# Patient Record
Sex: Male | Born: 1950
Health system: Southern US, Community
[De-identification: ages and names within clinical notes are randomized; demographics above are authoritative.]

## PROBLEM LIST (undated history)

## (undated) DIAGNOSIS — C61 Malignant neoplasm of prostate: Secondary | ICD-10-CM

## (undated) HISTORY — DX: Malignant neoplasm of prostate: C61

---

## 2017-07-25 ENCOUNTER — Encounter: Payer: Self-pay | Admitting: Family Medicine

## 2017-07-25 ENCOUNTER — Ambulatory Visit (INDEPENDENT_AMBULATORY_CARE_PROVIDER_SITE_OTHER): Payer: 59 | Admitting: Family Medicine

## 2017-07-25 VITALS — BP 110/78 | HR 62 | Temp 98.4°F | Ht 67.0 in | Wt 176.0 lb

## 2017-07-25 DIAGNOSIS — Z0000 Encounter for general adult medical examination without abnormal findings: Secondary | ICD-10-CM | POA: Diagnosis not present

## 2017-07-25 DIAGNOSIS — E782 Mixed hyperlipidemia: Secondary | ICD-10-CM

## 2017-07-25 DIAGNOSIS — Z125 Encounter for screening for malignant neoplasm of prostate: Secondary | ICD-10-CM | POA: Diagnosis not present

## 2017-07-25 DIAGNOSIS — Z1211 Encounter for screening for malignant neoplasm of colon: Secondary | ICD-10-CM

## 2017-07-25 DIAGNOSIS — Z131 Encounter for screening for diabetes mellitus: Secondary | ICD-10-CM | POA: Diagnosis not present

## 2017-07-25 LAB — CBC WITH DIFFERENTIAL/PLATELET
BASOS ABS: 0 10*3/uL (ref 0.0–0.1)
Basophils Relative: 0.4 % (ref 0.0–3.0)
Eosinophils Absolute: 0 10*3/uL (ref 0.0–0.7)
Eosinophils Relative: 0.8 % (ref 0.0–5.0)
HEMATOCRIT: 41.5 % (ref 39.0–52.0)
Hemoglobin: 14.4 g/dL (ref 13.0–17.0)
LYMPHS PCT: 24.7 % (ref 12.0–46.0)
Lymphs Abs: 1.4 10*3/uL (ref 0.7–4.0)
MCHC: 34.7 g/dL (ref 30.0–36.0)
MCV: 95.3 fl (ref 78.0–100.0)
MONOS PCT: 7.2 % (ref 3.0–12.0)
Monocytes Absolute: 0.4 10*3/uL (ref 0.1–1.0)
NEUTROS ABS: 3.9 10*3/uL (ref 1.4–7.7)
Neutrophils Relative %: 66.9 % (ref 43.0–77.0)
Platelets: 285 10*3/uL (ref 150.0–400.0)
RBC: 4.35 Mil/uL (ref 4.22–5.81)
RDW: 13.3 % (ref 11.5–15.5)
WBC: 5.8 10*3/uL (ref 4.0–10.5)

## 2017-07-25 LAB — LIPID PANEL
CHOL/HDL RATIO: 3
Cholesterol: 242 mg/dL — ABNORMAL HIGH (ref 0–200)
HDL: 83.7 mg/dL (ref >39.00)
LDL CALC: 127 mg/dL — AB (ref 0–99)
NONHDL: 158.7
Triglycerides: 159 mg/dL — ABNORMAL HIGH (ref 0.0–149.0)
VLDL: 31.8 mg/dL (ref 0.0–40.0)

## 2017-07-25 LAB — BASIC METABOLIC PANEL
BUN: 18 mg/dL (ref 6–23)
CHLORIDE: 103 meq/L (ref 96–112)
CO2: 28 meq/L (ref 19–32)
Calcium: 9.8 mg/dL (ref 8.4–10.5)
Creatinine, Ser: 0.84 mg/dL (ref 0.40–1.50)
GFR: 97.5 mL/min (ref >60.00)
Glucose, Bld: 96 mg/dL (ref 70–99)
Potassium: 4.3 mEq/L (ref 3.5–5.1)
Sodium: 141 mEq/L (ref 135–145)

## 2017-07-25 LAB — HEMOGLOBIN A1C: Hgb A1c MFr Bld: 5.6 % (ref 4.6–6.5)

## 2017-07-25 NOTE — Progress Notes (Signed)
Patient presents to clinic today to establish care.  SUBJECTIVE: PMH: Pt is a 67 yo male with pmh sig for HLD.  Pt has not had a pcp in yrs.  He lives in [place], but works in the area.  Patient is not on any medications, but does take supplements including: Centrum, green tea, saw palmetto, fish oil, B12.  Prostate concerns: -recently seen at [facility] for ED/testosterone replacement.   -was informed of elevated PSA (8.0) and advised to follow-up with a pcp.  -Pt denies urinary frequency, hesitancy, incomplete bladder emptying, dysuria, back pain, weight loss.   -Pt does note his stream is not as strong as it used to be.  Pt is concerned he has prostate cancer.  HLD: -Pt states his cholesterol was elevated at community health screen. -Pt is currently taking fish oil   Allergies: NKDA  Past surgical Hx: Pin in the left hand fifth digit  Social history: Pt is divorced.  Pt has a son and a daughter.  He currently has an Loss adjuster, chartered and works as a Actor.  Pt endorses daily alcohol use.  Pt endorses smoking in the past,  He quit 10 years ago.  Was smoking 1 pack/day x 20 years.  Pt denies drug use.  Family medical history: Mom-deceased at age 57, MI Dad-deceased at age 58, skin cancer Sister-Lynn, alive Sister-Pat, alive    Health Maintenance: Dental --Dr. Jetty Duhamel  Immunizations --none recently Colonoscopy --never had   History reviewed. No pertinent past medical history.  History reviewed. No pertinent surgical history.  No current outpatient medications on file prior to visit.   No current facility-administered medications on file prior to visit.     No Known Allergies  History reviewed. No pertinent family history.  Social History   Socioeconomic History  . Marital status: Unknown    Spouse name: Not on file  . Number of children: Not on file  . Years of education: Not on file  . Highest education level: Not on file  Occupational  History  . Not on file  Social Needs  . Financial resource strain: Not on file  . Food insecurity:    Worry: Not on file    Inability: Not on file  . Transportation needs:    Medical: Not on file    Non-medical: Not on file  Tobacco Use  . Smoking status: Never Smoker  . Smokeless tobacco: Never Used  Substance and Sexual Activity  . Alcohol use: Yes  . Drug use: Never  . Sexual activity: Not on file  Lifestyle  . Physical activity:    Days per week: Not on file    Minutes per session: Not on file  . Stress: Not on file  Relationships  . Social connections:    Talks on phone: Not on file    Gets together: Not on file    Attends religious service: Not on file    Active member of club or organization: Not on file    Attends meetings of clubs or organizations: Not on file    Relationship status: Not on file  . Intimate partner violence:    Fear of current or ex partner: Not on file    Emotionally abused: Not on file    Physically abused: Not on file    Forced sexual activity: Not on file  Other Topics Concern  . Not on file  Social History Narrative  . Not on file    ROS  General: Denies fever, chills, night sweats, changes in weight, changes in appetite HEENT: Denies headaches, ear pain, changes in vision, rhinorrhea, sore throat CV: Denies CP, palpitations, SOB, orthopnea Pulm: Denies SOB, cough, wheezing GI: Denies abdominal pain, nausea, vomiting, diarrhea, constipation GU: Denies dysuria, hematuria, frequency, vaginal discharge  +elevated PSA Msk: Denies muscle cramps, joint pains Neuro: Denies weakness, numbness, tingling Skin: Denies rashes, bruising Psych: Denies depression, anxiety, hallucinations  BP 110/78 (BP Location: Left Arm, Patient Position: Sitting, Cuff Size: Normal)   Pulse 62   Temp 98.4 F (36.9 C) (Oral)   Ht 5\' 7"  (1.702 m)   Wt 176 lb (79.8 kg)   SpO2 98%   BMI 27.57 kg/m   Physical Exam Gen. Pleasant, well developed,  well-nourished, in NAD HEENT - [place]/AT, PERRL, no scleral icterus, no nasal drainage, pharynx without erythema or exudate.  TMs normal bilaterally.  No cervical lymphadenopathy. Lungs: no use of accessory muscles, CTAB, no wheezes, rales or rhonchi Cardiovascular: RRR, No r/g/m, no peripheral edema Abdomen: BS present, soft, nontender, nondistended, no hepatosplenomegaly Musculoskeletal: No deformities, moves all four extremities, no cyanosis or clubbing, normal tone Neuro:  A&Ox3, CN II-XII intact, normal gait Skin:  Warm, dry, intact, no lesions  No results found for this or any previous visit (from the past 2160 hour(s)).  Assessment/Plan: Well adult exam  Anticipatory guidance given including wearing seatbelts, smoke detectors in the home, increasing physical activity, increasing p.o. intake of water and vegetables. - Plan: CBC with Differential/Platelet, Basic metabolic panel -Given handout -Patient to return in 1 week for  DRE/prostate exam  Prostate cancer screening  - Plan: PSA, Total and Free -If needed will refer to urology.  Mixed hyperlipidemia  - Plan: Lipid panel  Screening for diabetes mellitus  - Plan: Hemoglobin A1c  Screen for colon cancer  - Plan: Ambulatory referral to Gastroenterology   Follow-up in 1 week  Grier Mitts, MD

## 2017-07-25 NOTE — Patient Instructions (Signed)
Preventive Care 40-64 Years, Male Preventive care refers to lifestyle choices and visits with your health care provider that can promote health and wellness. What does preventive care include?  A yearly physical exam. This is also called an annual well check.  Dental exams once or twice a year.  Routine eye exams. Ask your health care provider how often you should have your eyes checked.  Personal lifestyle choices, including: ? Daily care of your teeth and gums. ? Regular physical activity. ? Eating a healthy diet. ? Avoiding tobacco and drug use. ? Limiting alcohol use. ? Practicing safe sex. ? Taking low-dose aspirin every day starting at age 39. What happens during an annual well check? The services and screenings done by your health care provider during your annual well check will depend on your age, overall health, lifestyle risk factors, and family history of disease. Counseling Your health care provider may ask you questions about your:  Alcohol use.  Tobacco use.  Drug use.  Emotional well-being.  Home and relationship well-being.  Sexual activity.  Eating habits.  Work and work Statistician.  Screening You may have the following tests or measurements:  Height, weight, and BMI.  Blood pressure.  Lipid and cholesterol levels. These may be checked every 5 years, or more frequently if you are over 76 years old.  Skin check.  Lung cancer screening. You may have this screening every year starting at age 19 if you have a 30-pack-year history of smoking and currently smoke or have quit within the past 15 years.  Fecal occult blood test (FOBT) of the stool. You may have this test every year starting at age 26.  Flexible sigmoidoscopy or colonoscopy. You may have a sigmoidoscopy every 5 years or a colonoscopy every 10 years starting at age 17.  Prostate cancer screening. Recommendations will vary depending on your family history and other risks.  Hepatitis C  blood test.  Hepatitis B blood test.  Sexually transmitted disease (STD) testing.  Diabetes screening. This is done by checking your blood sugar (glucose) after you have not eaten for a while (fasting). You may have this done every 1-3 years.  Discuss your test results, treatment options, and if necessary, the need for more tests with your health care provider. Vaccines Your health care provider may recommend certain vaccines, such as:  Influenza vaccine. This is recommended every year.  Tetanus, diphtheria, and acellular pertussis (Tdap, Td) vaccine. You may need a Td booster every 10 years.  Varicella vaccine. You may need this if you have not been vaccinated.  Zoster vaccine. You may need this after age 79.  Measles, mumps, and rubella (MMR) vaccine. You may need at least one dose of MMR if you were born in 1957 or later. You may also need a second dose.  Pneumococcal 13-valent conjugate (PCV13) vaccine. You may need this if you have certain conditions and have not been vaccinated.  Pneumococcal polysaccharide (PPSV23) vaccine. You may need one or two doses if you smoke cigarettes or if you have certain conditions.  Meningococcal vaccine. You may need this if you have certain conditions.  Hepatitis A vaccine. You may need this if you have certain conditions or if you travel or work in places where you may be exposed to hepatitis A.  Hepatitis B vaccine. You may need this if you have certain conditions or if you travel or work in places where you may be exposed to hepatitis B.  Haemophilus influenzae type b (Hib) vaccine.  You may need this if you have certain risk factors.  Talk to your health care provider about which screenings and vaccines you need and how often you need them. This information is not intended to replace advice given to you by your health care provider. Make sure you discuss any questions you have with your health care provider. Document Released: 02/21/2015  Document Revised: 10/15/2015 Document Reviewed: 11/26/2014 Elsevier Interactive Patient Education  2018 [place] Antigen Test Why am I having this test? The prostate-specific antigen (PSA) test is performed to determine how much PSA you have in your blood. PSA is a type of protein that is normally present in the prostate gland. Certain conditions can cause PSA blood levels to increase, such as:  Infection in the prostate (prostatitis).  Enlargement of the prostate (hypertrophy).  Prostate cancer.  Because PSA levels increase greatly from prostate cancer, this test can be used to confirm a diagnosis of prostate cancer. It may also be used to monitor treatment for prostate cancer and to watch for a return of prostate cancer after treatment has finished. This test has a very high false-positive rate. Therefore, routine PSA screening for all men is no longer recommended. A false-positive result is incorrect because it indicates a condition or finding is present when it is not. What kind of sample is taken? A blood sample is required for this test. It is usually collected by inserting a needle into a vein or by sticking a finger with a small needle. How do I prepare for this test? There is no preparation required for this test. However, there are factors that can affect the results of a PSA test. To get the most accurate results:  Avoid having a rectal exam within several hours before having your blood drawn for this test.  Avoid having any procedures performed on the prostate gland within 6 weeks of having this test.  Avoid ejaculating within 24 hours of having this test.  Tell your health care provider if you had a recent urinary tract infection (UTI).  Tell your health care provider if you are taking medicines to assist with hair growth, such as finasteride.  Tell your health care provider if you have been exposed to a medicine called diethylstilbestrol.  Let your  health care provider know if any of these factors apply to you. You may be asked to reschedule the test. What are the reference ranges? Reference ranges are established after testing a large group of people. Reference ranges may vary among different people, labs, and hospitals. It is your responsibility to obtain your test results. Ask the lab or department performing the test when and how you will get your results.  Low: 0-2.5 ng/mL.  Slightly to moderately elevated: 2.6-10.0 ng/mL.  Moderately elevated: 10.0-19.9 ng/mL.  Significantly elevated: 20 ng/mL or greater.  What do the results mean? PSA test results greater than 4 ng/mL are found in the majority of men with prostate cancer. If your test result is above this level, this can indicate an increased risk for prostate cancer. Increased PSA levels can also indicate other health conditions. Talk with your health care provider to discuss your results, treatment options, and if necessary, the need for more tests. Talk with your health care provider if you have any questions about your results. Talk with your health care provider to discuss your results, treatment options, and if necessary, the need for more tests. Talk with your health care provider if you have any questions about  your results. This information is not intended to replace advice given to you by your health care provider. Make sure you discuss any questions you have with your health care provider. Document Released: 02/28/2004 Document Revised: 10/01/2015 Document Reviewed: 06/20/2013 Elsevier Interactive Patient Education  [employer].

## 2017-07-26 LAB — PSA, TOTAL AND FREE
PSA, % FREE: 8 % — AB (ref >25)
PSA, FREE: 0.6 ng/mL
PSA, Total: 7.8 ng/mL — ABNORMAL HIGH (ref <=4.0)

## 2017-07-27 ENCOUNTER — Other Ambulatory Visit: Payer: Self-pay | Admitting: Family Medicine

## 2017-07-27 DIAGNOSIS — R972 Elevated prostate specific antigen [PSA]: Secondary | ICD-10-CM

## 2017-07-29 ENCOUNTER — Other Ambulatory Visit: Payer: Self-pay | Admitting: Family Medicine

## 2017-07-29 DIAGNOSIS — E7800 Pure hypercholesterolemia, unspecified: Secondary | ICD-10-CM

## 2017-08-03 ENCOUNTER — Ambulatory Visit: Payer: 59 | Admitting: Family Medicine

## 2017-08-23 ENCOUNTER — Ambulatory Visit: Payer: Self-pay | Admitting: Adult Health

## 2017-09-01 DIAGNOSIS — R972 Elevated prostate specific antigen [PSA]: Secondary | ICD-10-CM | POA: Diagnosis not present

## 2017-09-01 DIAGNOSIS — R351 Nocturia: Secondary | ICD-10-CM | POA: Diagnosis not present

## 2017-09-01 DIAGNOSIS — R350 Frequency of micturition: Secondary | ICD-10-CM | POA: Diagnosis not present

## 2017-10-13 DIAGNOSIS — C61 Malignant neoplasm of prostate: Secondary | ICD-10-CM | POA: Diagnosis not present

## 2017-10-20 DIAGNOSIS — C61 Malignant neoplasm of prostate: Secondary | ICD-10-CM | POA: Diagnosis not present

## 2017-11-03 ENCOUNTER — Encounter: Payer: Self-pay | Admitting: Family Medicine

## 2017-11-03 ENCOUNTER — Ambulatory Visit (INDEPENDENT_AMBULATORY_CARE_PROVIDER_SITE_OTHER): Payer: 59 | Admitting: Family Medicine

## 2017-11-03 VITALS — BP 108/68 | HR 88 | Temp 98.8°F | Wt 173.0 lb

## 2017-11-03 DIAGNOSIS — Z8781 Personal history of (healed) traumatic fracture: Secondary | ICD-10-CM

## 2017-11-03 DIAGNOSIS — C61 Malignant neoplasm of prostate: Secondary | ICD-10-CM

## 2017-11-03 DIAGNOSIS — M5442 Lumbago with sciatica, left side: Secondary | ICD-10-CM

## 2017-11-03 MED ORDER — CYCLOBENZAPRINE HCL 5 MG PO TABS: 5.0000 mg | ORAL_TABLET | Freq: Three times a day (TID) | ORAL | 1 refills | Status: DC | PRN

## 2017-11-03 NOTE — Progress Notes (Signed)
Subjective:    Patient ID: Charles Houston, male    DOB: 1950/03/01, 67 y.o.   MRN: [id number]  No chief complaint on file.   HPI Patient was seen today for f/u and acute concern.  Pt notes he has seen Urology s/p elevated PSA (7.8) and has been dx'd with prostate cancer.  Pt states he is dealing with the dx.  Pt is scheduled to have an MRI in a few wks, but expresses concerns about possible metal in his body.  Pt had pins in his L 5th digit, but is unsure if they are remaining as he had 2 procedures on that finger.  Pt does not recall the name of the place in [place] where he had this done.  Pt notes back pain and L sided posterior L pain after digging a ditch 2 wks ago.  Pt has taken aleeve in the am and flexeril in the evening.  History reviewed. No pertinent past medical history.  No Known Allergies  ROS General: Denies fever, chills, night sweats, changes in weight, changes in appetite HEENT: Denies headaches, ear pain, changes in vision, rhinorrhea, sore throat CV: Denies CP, palpitations, SOB, orthopnea Pulm: Denies SOB, cough, wheezing GI: Denies abdominal pain, nausea, vomiting, diarrhea, constipation GU: Denies dysuria, hematuria, frequency, vaginal discharge  +prostate cancer Msk: Denies muscle cramps, joint pains  +Low back pain and L leg pain.  H/o L 5th digit fracture. Neuro: Denies weakness, numbness, tingling Skin: Denies rashes, bruising Psych: Denies depression, anxiety, hallucinations     Objective:    Blood pressure 108/68, pulse 88, temperature 98.8 F (37.1 C), temperature source Oral, weight 173 lb (78.5 kg), SpO2 95 %.   Gen. Pleasant, well-nourished, in no distress, normal affect   Lungs: no accessory muscle use, CTAB, no wheezes or rales Cardiovascular: RRR, no m/r/g, no peripheral edema Musculoskeletal: No TTP of spine, paraspinal muscles.  Mild TTP of L sciatic nerve.  Decreased flexion of lumbar spine 2/2 back discomfort.  No deformities, no cyanosis  or clubbing. Neuro:  A&Ox3, CN II-XII intact, normal gait   Wt Readings from Last 3 Encounters:  11/03/17 173 lb (78.5 kg)  07/25/17 176 lb (79.8 kg)    Lab Results  Component Value Date   WBC 5.8 07/25/2017   HGB 14.4 07/25/2017   HCT 41.5 07/25/2017   PLT 285.0 07/25/2017   GLUCOSE 96 07/25/2017   CHOL 242 (H) 07/25/2017   TRIG 159.0 (H) 07/25/2017   HDL 83.70 07/25/2017   LDLCALC 127 (H) 07/25/2017   NA 141 07/25/2017   K 4.3 07/25/2017   CL 103 07/25/2017   CREATININE 0.84 07/25/2017   BUN 18 07/25/2017   CO2 28 07/25/2017   HGBA1C 5.6 07/25/2017    Assessment/Plan:  Acute left-sided low back pain with left-sided sciatica  -discussed stretching exercises -ok to continue Aleeve prn, heat, massage - Plan: cyclobenzaprine (FLEXERIL) 5 MG tablet  Prostate cancer ([place]) -continue f/u with Urology  History of fracture of finger  -pt to return to clinic as xray was unavailable Thursday afternoon - Plan: DG Hand 2 View Left  F/u prn  Grier Mitts, MD

## 2017-11-03 NOTE — Patient Instructions (Signed)
Sciatica Sciatica is pain, numbness, weakness, or tingling along the path of the sciatic nerve. The sciatic nerve starts in the lower back and runs down the back of each leg. The nerve controls the muscles in the lower leg and in the back of the knee. It also provides feeling (sensation) to the back of the thigh, the lower leg, and the sole of the foot. Sciatica is a symptom of another medical condition that pinches or puts pressure on the sciatic nerve. Generally, sciatica only affects one side of the body. Sciatica usually goes away on its own or with treatment. In some cases, sciatica may keep coming back (recur). What are the causes? This condition is caused by pressure on the sciatic nerve, or pinching of the sciatic nerve. This may be the result of:  A disk in between the bones of the spine (vertebrae) bulging out too far (herniated disk).  Age-related changes in the spinal disks (degenerative disk disease).  A pain disorder that affects a muscle in the buttock (piriformis syndrome).  Extra bone growth (bone spur) near the sciatic nerve.  An injury or break (fracture) of the pelvis.  Pregnancy.  Tumor (rare). What increases the risk? The following factors may make you more likely to develop this condition:  Playing sports that place pressure or stress on the spine, such as football or weight lifting.  Having poor strength and flexibility.  A history of back injury.  A history of back surgery.  Sitting for long periods of time.  Doing activities that involve repetitive bending or lifting.  Obesity. What are the signs or symptoms? Symptoms can vary from mild to very severe, and they may include:  Any of these problems in the lower back, leg, hip, or buttock:  Mild tingling or dull aches.  Burning sensations.  Sharp pains.  Numbness in the back of the calf or the sole of the foot.  Leg weakness.  Severe back pain that makes movement difficult. These symptoms may  get worse when you cough, sneeze, or laugh, or when you sit or stand for long periods of time. Being overweight may also make symptoms worse. In some cases, symptoms may recur over time. How is this diagnosed? This condition may be diagnosed based on:  Your symptoms.  A physical exam. Your health care provider may ask you to do certain movements to check whether those movements trigger your symptoms.  You may have tests, including:  Blood tests.  X-rays.  MRI.  CT scan. How is this treated? In many cases, this condition improves on its own, without any treatment. However, treatment may include:  Reducing or modifying physical activity during periods of pain.  Exercising and stretching to strengthen your abdomen and improve the flexibility of your spine.  Icing and applying heat to the affected area.  Medicines that help:  To relieve pain and swelling.  To relax your muscles.  Injections of medicines that help to relieve pain, irritation, and inflammation around the sciatic nerve (steroids).  Surgery. Follow these instructions at home: Medicines   Take over-the-counter and prescription medicines only as told by your health care provider.  Do not drive or operate heavy machinery while taking prescription pain medicine. Managing pain   If directed, apply ice to the affected area.  Put ice in a plastic bag.  Place a towel between your skin and the bag.  Leave the ice on for 20 minutes, 2-3 times a day.  After icing, apply heat to the   heat to the affected area before you exercise or as often as told by your health care provider. Use the heat source that your health care provider recommends, such as a moist heat pack or a heating pad. ? Place a towel between your skin and the heat source. ? Leave the heat on for 20-30 minutes. ? Remove the heat if your skin turns bright red. This is especially important if you are unable to feel pain, heat, or cold. You may have a  greater risk of getting burned. Activity  Return to your normal activities as told by your health care provider. Ask your health care provider what activities are safe for you. ? Avoid activities that make your symptoms worse.  Take brief periods of rest throughout the day. Resting in a lying or standing position is usually better than sitting to rest. ? When you rest for longer periods, mix in some mild activity or stretching between periods of rest. This will help to prevent stiffness and pain. ? Avoid sitting for long periods of time without moving. Get up and move around at least one time each hour.  Exercise and stretch regularly, as told by your health care provider.  Do not lift anything that is heavier than 10 lb (4.5 kg) while you have symptoms of sciatica. When you do not have symptoms, you should still avoid heavy lifting, especially repetitive heavy lifting.  When you lift objects, always use proper lifting technique, which includes: ? Bending your knees. ? Keeping the load close to your body. ? Avoiding twisting. General instructions  Use good posture. ? Avoid leaning forward while sitting. ? Avoid hunching over while standing.  Maintain a healthy weight. Excess weight puts extra stress on your back and makes it difficult to maintain good posture.  Wear supportive, comfortable shoes. Avoid wearing high heels.  Avoid sleeping on a mattress that is too soft or too hard. A mattress that is firm enough to support your back when you sleep may help to reduce your pain.  Keep all follow-up visits as told by your health care provider. This is important. Contact a health care provider if:  You have pain that wakes you up when you are sleeping.  You have pain that gets worse when you lie down.  Your pain is worse than you have experienced in the past.  Your pain lasts longer than 4 weeks.  You experience unexplained weight loss. Get help right away if:  You lose control  of your bowel or bladder (incontinence).  You have: ? Weakness in your lower back, pelvis, buttocks, or legs that gets worse. ? Redness or swelling of your back. ? A burning sensation when you urinate. This information is not intended to replace advice given to you by your health care provider. Make sure you discuss any questions you have with your health care provider. Document Released: 01/19/2001 Document Revised: 07/01/2015 Document Reviewed: 10/04/2014 Elsevier Interactive Patient Education  2018 [employer].  Sciatica Rehab Ask your health care provider which exercises are safe for you. Do exercises exactly as told by your health care provider and adjust them as directed. It is normal to feel mild stretching, pulling, tightness, or discomfort as you do these exercises, but you should stop right away if you feel sudden pain or your pain gets worse.Do not begin these exercises until told by your health care provider. Stretching and range of motion exercises These exercises warm up your muscles and joints and improve the movement  and flexibility of your hips and your back. These exercises also help to relieve pain, numbness, and tingling. Exercise A: Sciatic nerve glide 1. Sit in a chair with your head facing down toward your chest. Place your hands behind your back. Let your shoulders slump forward. 2. Slowly straighten one of your knees while you tilt your head back as if you are looking toward the ceiling. Only straighten your leg as far as you can without making your symptoms worse. 3. Hold for __________ seconds. 4. Slowly return to the starting position. 5. Repeat with your other leg. Repeat __________ times. Complete this exercise __________ times a day. Exercise B: Knee to chest with hip adduction and internal rotation  1. Lie on your back on a firm surface with both legs straight. 2. Bend one of your knees and move it up toward your chest until you feel a gentle stretch in your  lower back and buttock. Then, move your knee toward the shoulder that is on the opposite side from your leg. ? Hold your leg in this position by holding onto the front of your knee. 3. Hold for __________ seconds. 4. Slowly return to the starting position. 5. Repeat with your other leg. Repeat __________ times. Complete this exercise __________ times a day. Exercise C: Prone extension on elbows  1. Lie on your abdomen on a firm surface. A bed may be too soft for this exercise. 2. Prop yourself up on your elbows. 3. Use your arms to help lift your chest up until you feel a gentle stretch in your abdomen and your lower back. ? This will place some of your body weight on your elbows. If this is uncomfortable, try stacking pillows under your chest. ? Your hips should stay down, against the surface that you are lying on. Keep your hip and back muscles relaxed. 4. Hold for __________ seconds. 5. Slowly relax your upper body and return to the starting position. Repeat __________ times. Complete this exercise __________ times a day. Strengthening exercises These exercises build strength and endurance in your back. Endurance is the ability to use your muscles for a long time, even after they get tired. Exercise D: Pelvic tilt 1. Lie on your back on a firm surface. Bend your knees and keep your feet flat. 2. Tense your abdominal muscles. Tip your pelvis up toward the ceiling and flatten your lower back into the floor. ? To help with this exercise, you may place a small towel under your lower back and try to push your back into the towel. 3. Hold for __________ seconds. 4. Let your muscles relax completely before you repeat this exercise. Repeat __________ times. Complete this exercise __________ times a day. Exercise E: Alternating arm and leg raises  1. Get on your hands and knees on a firm surface. If you are on a hard floor, you may want to use padding to cushion your knees, such as an exercise  mat. 2. Line up your arms and legs. Your hands should be below your shoulders, and your knees should be below your hips. 3. Lift your left leg behind you. At the same time, raise your right arm and straighten it in front of you. ? Do not lift your leg higher than your hip. ? Do not lift your arm higher than your shoulder. ? Keep your abdominal and back muscles tight. ? Keep your hips facing the ground. ? Do not arch your back. ? Keep your balance carefully, and do not  hold your breath. 4. Hold for __________ seconds. 5. Slowly return to the starting position and repeat with your right leg and your left arm. Repeat __________ times. Complete this exercise __________ times a day. Posture and body mechanics  Body mechanics refers to the movements and positions of your body while you do your daily activities. Posture is part of body mechanics. Good posture and healthy body mechanics can help to relieve stress in your body's tissues and joints. Good posture means that your spine is in its natural S-curve position (your spine is neutral), your shoulders are pulled back slightly, and your head is not tipped forward. The following are general guidelines for applying improved posture and body mechanics to your everyday activities. Standing   When standing, keep your spine neutral and your feet about hip-width apart. Keep a slight bend in your knees. Your ears, shoulders, and hips should line up.  When you do a task in which you stand in one place for a long time, place one foot up on a stable object that is 2-4 inches (5-10 cm) high, such as a footstool. This helps keep your spine neutral. Sitting   When sitting, keep your spine neutral and keep your feet flat on the floor. Use a footrest, if necessary, and keep your thighs parallel to the floor. Avoid rounding your shoulders, and avoid tilting your head forward.  When working at a desk or a computer, keep your desk at a height where your hands are  slightly lower than your elbows. Slide your chair under your desk so you are close enough to maintain good posture.  When working at a computer, place your monitor at a height where you are looking straight ahead and you do not have to tilt your head forward or downward to look at the screen. Resting   When lying down and resting, avoid positions that are most painful for you.  If you have pain with activities such as sitting, bending, stooping, or squatting (flexion-based activities), lie in a position in which your body does not bend very much. For example, avoid curling up on your side with your arms and knees near your chest (fetal position).  If you have pain with activities such as standing for a long time or reaching with your arms (extension-based activities), lie with your spine in a neutral position and bend your knees slightly. Try the following positions: ? Lying on your side with a pillow between your knees. ? Lying on your back with a pillow under your knees. Lifting   When lifting objects, keep your feet at least shoulder-width apart and tighten your abdominal muscles.  Bend your knees and hips and keep your spine neutral. It is important to lift using the strength of your legs, not your back. Do not lock your knees straight out.  Always ask for help to lift heavy or awkward objects. This information is not intended to replace advice given to you by your health care provider. Make sure you discuss any questions you have with your health care provider. Document Released: 01/25/2005 Document Revised: 10/02/2015 Document Reviewed: 10/11/2014 Elsevier Interactive Patient Education  2018 [employer].  Back Exercises If you have pain in your back, do these exercises 2-3 times each day or as told by your doctor. When the pain goes away, do the exercises once each day, but repeat the steps more times for each exercise (do more repetitions). If you do not have pain in your back, do  these  exercises once each day or as told by your doctor. Exercises Single Knee to Chest  Do these steps 3-5 times in a row for each leg: 1. Lie on your back on a firm bed or the floor with your legs stretched out. 2. Bring one knee to your chest. 3. Hold your knee to your chest by grabbing your knee or thigh. 4. Pull on your knee until you feel a gentle stretch in your lower back. 5. Keep doing the stretch for 10-30 seconds. 6. Slowly let go of your leg and straighten it.  Pelvic Tilt  Do these steps 5-10 times in a row: 1. Lie on your back on a firm bed or the floor with your legs stretched out. 2. Bend your knees so they point up to the ceiling. Your feet should be flat on the floor. 3. Tighten your lower belly (abdomen) muscles to press your lower back against the floor. This will make your tailbone point up to the ceiling instead of pointing down to your feet or the floor. 4. Stay in this position for 5-10 seconds while you gently tighten your muscles and breathe evenly.  Cat-Cow  Do these steps until your lower back bends more easily: 1. Get on your hands and knees on a firm surface. Keep your hands under your shoulders, and keep your knees under your hips. You may put padding under your knees. 2. Let your head hang down, and make your tailbone point down to the floor so your lower back is round like the back of a cat. 3. Stay in this position for 5 seconds. 4. Slowly lift your head and make your tailbone point up to the ceiling so your back hangs low (sags) like the back of a cow. 5. Stay in this position for 5 seconds.  Press-Ups  Do these steps 5-10 times in a row: 1. Lie on your belly (face-down) on the floor. 2. Place your hands near your head, about shoulder-width apart. 3. While you keep your back relaxed and keep your hips on the floor, slowly straighten your arms to raise the top half of your body and lift your shoulders. Do not use your back muscles. To make yourself  more comfortable, you may change where you place your hands. 4. Stay in this position for 5 seconds. 5. Slowly return to lying flat on the floor.  Bridges  Do these steps 10 times in a row: 1. Lie on your back on a firm surface. 2. Bend your knees so they point up to the ceiling. Your feet should be flat on the floor. 3. Tighten your butt muscles and lift your butt off of the floor until your waist is almost as high as your knees. If you do not feel the muscles working in your butt and the back of your thighs, slide your feet 1-2 inches farther away from your butt. 4. Stay in this position for 3-5 seconds. 5. Slowly lower your butt to the floor, and let your butt muscles relax.  If this exercise is too easy, try doing it with your arms crossed over your chest. Belly Crunches  Do these steps 5-10 times in a row: 1. Lie on your back on a firm bed or the floor with your legs stretched out. 2. Bend your knees so they point up to the ceiling. Your feet should be flat on the floor. 3. Cross your arms over your chest. 4. Tip your chin a little bit toward your chest but  do not bend your neck. 5. Tighten your belly muscles and slowly raise your chest just enough to lift your shoulder blades a tiny bit off of the floor. 6. Slowly lower your chest and your head to the floor.  Back Lifts Do these steps 5-10 times in a row: 1. Lie on your belly (face-down) with your arms at your sides, and rest your forehead on the floor. 2. Tighten the muscles in your legs and your butt. 3. Slowly lift your chest off of the floor while you keep your hips on the floor. Keep the back of your head in line with the curve in your back. Look at the floor while you do this. 4. Stay in this position for 3-5 seconds. 5. Slowly lower your chest and your face to the floor.  Contact a doctor if:  Your back pain gets a lot worse when you do an exercise.  Your back pain does not lessen 2 hours after you exercise. If you  have any of these problems, stop doing the exercises. Do not do them again unless your doctor says it is okay. Get help right away if:  You have sudden, very bad back pain. If this happens, stop doing the exercises. Do not do them again unless your doctor says it is okay. This information is not intended to replace advice given to you by your health care provider. Make sure you discuss any questions you have with your health care provider. Document Released: 02/27/2010 Document Revised: 07/03/2015 Document Reviewed: 03/21/2014 Elsevier Interactive Patient Education  [employer].

## 2017-11-06 ENCOUNTER — Encounter: Payer: Self-pay | Admitting: Family Medicine

## 2017-11-09 DIAGNOSIS — M9903 Segmental and somatic dysfunction of lumbar region: Secondary | ICD-10-CM | POA: Diagnosis not present

## 2017-11-09 DIAGNOSIS — M5137 Other intervertebral disc degeneration, lumbosacral region: Secondary | ICD-10-CM | POA: Diagnosis not present

## 2017-11-09 DIAGNOSIS — M9914 Subluxation complex (vertebral) of sacral region: Secondary | ICD-10-CM | POA: Diagnosis not present

## 2017-11-09 DIAGNOSIS — M9905 Segmental and somatic dysfunction of pelvic region: Secondary | ICD-10-CM | POA: Diagnosis not present

## 2017-11-16 DIAGNOSIS — M9914 Subluxation complex (vertebral) of sacral region: Secondary | ICD-10-CM | POA: Diagnosis not present

## 2017-11-16 DIAGNOSIS — M9905 Segmental and somatic dysfunction of pelvic region: Secondary | ICD-10-CM | POA: Diagnosis not present

## 2017-11-16 DIAGNOSIS — M5137 Other intervertebral disc degeneration, lumbosacral region: Secondary | ICD-10-CM | POA: Diagnosis not present

## 2017-11-16 DIAGNOSIS — M9903 Segmental and somatic dysfunction of lumbar region: Secondary | ICD-10-CM | POA: Diagnosis not present

## 2017-11-23 DIAGNOSIS — M9905 Segmental and somatic dysfunction of pelvic region: Secondary | ICD-10-CM | POA: Diagnosis not present

## 2017-11-23 DIAGNOSIS — M5137 Other intervertebral disc degeneration, lumbosacral region: Secondary | ICD-10-CM | POA: Diagnosis not present

## 2017-11-23 DIAGNOSIS — M9914 Subluxation complex (vertebral) of sacral region: Secondary | ICD-10-CM | POA: Diagnosis not present

## 2017-11-23 DIAGNOSIS — M9903 Segmental and somatic dysfunction of lumbar region: Secondary | ICD-10-CM | POA: Diagnosis not present

## 2017-12-29 ENCOUNTER — Ambulatory Visit (INDEPENDENT_AMBULATORY_CARE_PROVIDER_SITE_OTHER): Payer: Medicare Other

## 2017-12-29 ENCOUNTER — Other Ambulatory Visit (INDEPENDENT_AMBULATORY_CARE_PROVIDER_SITE_OTHER): Payer: Medicare Other

## 2017-12-29 DIAGNOSIS — E7800 Pure hypercholesterolemia, unspecified: Secondary | ICD-10-CM | POA: Diagnosis not present

## 2017-12-29 DIAGNOSIS — Z8781 Personal history of (healed) traumatic fracture: Secondary | ICD-10-CM | POA: Diagnosis not present

## 2017-12-29 DIAGNOSIS — M19042 Primary osteoarthritis, left hand: Secondary | ICD-10-CM | POA: Diagnosis not present

## 2017-12-29 LAB — LIPID PANEL
CHOL/HDL RATIO: 2
Cholesterol: 165 mg/dL (ref 0–200)
HDL: 67.2 mg/dL (ref >39.00)
LDL CALC: 84 mg/dL (ref 0–99)
NonHDL: 97.33
TRIGLYCERIDES: 69 mg/dL (ref 0.0–149.0)
VLDL: 13.8 mg/dL (ref 0.0–40.0)

## 2018-01-11 ENCOUNTER — Ambulatory Visit (INDEPENDENT_AMBULATORY_CARE_PROVIDER_SITE_OTHER): Payer: Medicare Other | Admitting: Family Medicine

## 2018-01-11 ENCOUNTER — Encounter: Payer: Self-pay | Admitting: Family Medicine

## 2018-01-11 VITALS — BP 110/78 | HR 104 | Temp 98.0°F | Wt 173.0 lb

## 2018-01-11 DIAGNOSIS — M19032 Primary osteoarthritis, left wrist: Secondary | ICD-10-CM

## 2018-01-11 DIAGNOSIS — M25511 Pain in right shoulder: Secondary | ICD-10-CM

## 2018-01-11 DIAGNOSIS — M25512 Pain in left shoulder: Secondary | ICD-10-CM

## 2018-01-11 DIAGNOSIS — M25531 Pain in right wrist: Secondary | ICD-10-CM | POA: Diagnosis not present

## 2018-01-11 MED ORDER — PREDNISONE 10 MG PO TABS: ORAL_TABLET | ORAL | 0 refills | Status: DC

## 2018-01-11 NOTE — Patient Instructions (Addendum)
Prednisone tablets What is this medicine? PREDNISONE (PRED ni sone) is a corticosteroid. It is commonly used to treat inflammation of the skin, joints, lungs, and other organs. Common conditions treated include asthma, allergies, and arthritis. It is also used for other conditions, such as blood disorders and diseases of the adrenal glands. This medicine may be used for other purposes; ask your health care provider or pharmacist if you have questions. COMMON BRAND NAME(S): Deltasone, Predone, Sterapred, Sterapred DS What should I tell my health care provider before I take this medicine? They need to know if you have any of these conditions: -Cushing's syndrome -diabetes -glaucoma -heart disease -high blood pressure -infection (especially a virus infection such as chickenpox, cold sores, or herpes) -kidney disease -liver disease -mental illness -myasthenia gravis -osteoporosis -seizures -stomach or intestine problems -thyroid disease -an unusual or allergic reaction to lactose, prednisone, other medicines, foods, dyes, or preservatives -pregnant or trying to get pregnant -breast-feeding How should I use this medicine? Take this medicine by mouth with a glass of water. Follow the directions on the prescription label. Take this medicine with food. If you are taking this medicine once a day, take it in the morning. Do not take more medicine than you are told to take. Do not suddenly stop taking your medicine because you may develop a severe reaction. Your doctor will tell you how much medicine to take. If your doctor wants you to stop the medicine, the dose may be slowly lowered over time to avoid any side effects. Talk to your pediatrician regarding the use of this medicine in children. Special care may be needed. Overdosage: If you think you have taken too much of this medicine contact a poison control center or emergency room at once. NOTE: This medicine is only for you. Do not share this  medicine with others. What if I miss a dose? If you miss a dose, take it as soon as you can. If it is almost time for your next dose, talk to your doctor or health care professional. You may need to miss a dose or take an extra dose. Do not take double or extra doses without advice. What may interact with this medicine? Do not take this medicine with any of the following medications: -metyrapone -mifepristone This medicine may also interact with the following medications: -aminoglutethimide -amphotericin B -aspirin and aspirin-like medicines -barbiturates -certain medicines for diabetes, like glipizide or glyburide -cholestyramine -cholinesterase inhibitors -cyclosporine -digoxin -diuretics -ephedrine -male hormones, like estrogens and birth control pills -isoniazid -ketoconazole -NSAIDS, medicines for pain and inflammation, like ibuprofen or naproxen -phenytoin -rifampin -toxoids -vaccines -warfarin This list may not describe all possible interactions. Give your health care provider a list of all the medicines, herbs, non-prescription drugs, or dietary supplements you use. Also tell them if you smoke, drink alcohol, or use illegal drugs. Some items may interact with your medicine. What should I watch for while using this medicine? Visit your doctor or health care professional for regular checks on your progress. If you are taking this medicine over a prolonged period, carry an identification card with your name and address, the type and dose of your medicine, and your doctor's name and address. This medicine may increase your risk of getting an infection. Tell your doctor or health care professional if you are around anyone with measles or chickenpox, or if you develop sores or blisters that do not heal properly. If you are going to have surgery, tell your doctor or health care professional that  you have taken this medicine within the last twelve months. Ask your doctor or health  care professional about your diet. You may need to lower the amount of salt you eat. This medicine may affect blood sugar levels. If you have diabetes, check with your doctor or health care professional before you change your diet or the dose of your diabetic medicine. What side effects may I notice from receiving this medicine? Side effects that you should report to your doctor or health care professional as soon as possible: -allergic reactions like skin rash, itching or hives, swelling of the face, lips, or tongue -changes in emotions or moods -changes in vision -depressed mood -eye pain -fever or chills, cough, sore throat, pain or difficulty passing urine -increased thirst -swelling of ankles, feet Side effects that usually do not require medical attention (report to your doctor or health care professional if they continue or are bothersome): -confusion, excitement, restlessness -headache -nausea, vomiting -skin problems, acne, thin and shiny skin -trouble sleeping -weight gain This list may not describe all possible side effects. Call your doctor for medical advice about side effects. You may report side effects to FDA at 1-800-FDA-1088. Where should I keep my medicine? Keep out of the reach of children. Store at room temperature between 15 and 30 degrees C (59 and 86 degrees F). Protect from light. Keep container tightly closed. Throw away any unused medicine after the expiration date. NOTE: This sheet is a summary. It may not cover all possible information. If you have questions about this medicine, talk to your doctor, pharmacist, or health care provider.  2018 Elsevier/Gold Standard (2010-09-10 10:57:14)  Osteoarthritis Osteoarthritis is a type of arthritis that affects tissue that covers the ends of bones in joints (cartilage). Cartilage acts as a cushion between the bones and helps them move smoothly. Osteoarthritis results when cartilage in the joints gets worn down.  Osteoarthritis is sometimes called "wear and tear" arthritis. Osteoarthritis is the most common form of arthritis. It often occurs in older people. It is a condition that gets worse over time (a progressive condition). Joints that are most often affected by this condition are in:  Fingers.  Toes.  Hips.  Knees.  Spine, including neck and lower back.  What are the causes? This condition is caused by age-related wearing down of cartilage that covers the ends of bones. What increases the risk? The following factors may make you more likely to develop this condition:  Older age.  Being overweight or obese.  Overuse of joints, such as in athletes.  Past injury of a joint.  Past surgery on a joint.  Family history of osteoarthritis.  What are the signs or symptoms? The main symptoms of this condition are pain, swelling, and stiffness in the joint. The joint may lose its shape over time. Small pieces of bone or cartilage may break off and float inside of the joint, which may cause more pain and damage to the joint. Small deposits of bone (osteophytes) may grow on the edges of the joint. Other symptoms may include:  A grating or scraping feeling inside the joint when you move it.  Popping or creaking sounds when you move.  Symptoms may affect one or more joints. Osteoarthritis in a major joint, such as your knee or hip, can make it painful to walk or exercise. If you have osteoarthritis in your hands, you might not be able to grip items, twist your hand, or control small movements of your hands and fingers (  fine motor skills). How is this diagnosed? This condition may be diagnosed based on:  Your medical history.  A physical exam.  Your symptoms.  X-rays of the affected joint(s).  Blood tests to rule out other types of arthritis.  How is this treated? There is no cure for this condition, but treatment can help to control pain and improve joint function. Treatment plans may  include:  A prescribed exercise program that allows for rest and joint relief. You may work with a physical therapist.  A weight control plan.  Pain relief techniques, such as: ? Applying heat and cold to the joint. ? Electric pulses delivered to nerve endings under the skin (transcutaneous electrical nerve stimulation, or TENS). ? Massage. ? Certain nutritional supplements.  NSAIDs or prescription medicines to help relieve pain.  Medicine to help relieve pain and inflammation (corticosteroids). This can be given by mouth (orally) or as an injection.  Assistive devices, such as a brace, wrap, splint, specialized glove, or cane.  Surgery, such as: ? An osteotomy. This is done to reposition the bones and relieve pain or to remove loose pieces of bone and cartilage. ? Joint replacement surgery. You may need this surgery if you have very bad (advanced) osteoarthritis.  Follow these instructions at home: Activity  Rest your affected joints as directed by your health care provider.  Do not drive or use heavy machinery while taking prescription pain medicine.  Exercise as directed. Your health care provider or physical therapist may recommend specific types of exercise, such as: ? Strengthening exercises. These are done to strengthen the muscles that support joints that are affected by arthritis. They can be performed with weights or with exercise bands to add resistance. ? Aerobic activities. These are exercises, such as brisk walking or water aerobics, that get your heart pumping. ? Range-of-motion activities. These keep your joints easy to move. ? Balance and agility exercises. Managing pain, stiffness, and swelling  If directed, apply heat to the affected area as often as told by your health care provider. Use the heat source that your health care provider recommends, such as a moist heat pack or a heating pad. ? If you have a removable assistive device, remove it as told by your  health care provider. ? Place a towel between your skin and the heat source. If your health care provider tells you to keep the assistive device on while you apply heat, place a towel between the assistive device and the heat source. ? Leave the heat on for 20-30 minutes. ? Remove the heat if your skin turns bright red. This is especially important if you are unable to feel pain, heat, or cold. You may have a greater risk of getting burned.  If directed, put ice on the affected joint: ? If you have a removable assistive device, remove it as told by your health care provider. ? Put ice in a plastic bag. ? Place a towel between your skin and the bag. If your health care provider tells you to keep the assistive device on during icing, place a towel between the assistive device and the bag. ? Leave the ice on for 20 minutes, 2-3 times a day. General instructions  Take over-the-counter and prescription medicines only as told by your health care provider.  Maintain a healthy weight. Follow instructions from your health care provider for weight control. These may include dietary restrictions.  Do not use any products that contain nicotine or tobacco, such as cigarettes and  e-cigarettes. These can delay bone healing. If you need help quitting, ask your health care provider.  Use assistive devices as directed by your health care provider.  Keep all follow-up visits as told by your health care provider. This is important. Where to find more information:  [employer] of Arthritis and Musculoskeletal and Skin Diseases: www.niams.SouthExposed.es  [employer] on Aging: http://kim-miller.com/  American College of Rheumatology: www.rheumatology.org Contact a health care provider if:  Your skin turns red.  You develop a rash.  You have pain that gets worse.  You have a fever along with joint or muscle aches. Get help right away if:  You lose a lot of weight.  You suddenly lose your  appetite.  You have night sweats. Summary  Osteoarthritis is a type of arthritis that affects tissue covering the ends of bones in joints (cartilage).  This condition is caused by age-related wearing down of cartilage that covers the ends of bones.  The main symptom of this condition is pain, swelling, and stiffness in the joint.  There is no cure for this condition, but treatment can help to control pain and improve joint function. This information is not intended to replace advice given to you by your health care provider. Make sure you discuss any questions you have with your health care provider. Document Released: 01/25/2005 Document Revised: 09/29/2015 Document Reviewed: 09/29/2015 Elsevier Interactive Patient Education  2018 [employer].  Wrist Pain, Adult There are many things that can cause wrist pain. Some common causes include:  An injury to the wrist area, such as a sprain, strain, or fracture.  Overuse of the joint.  A condition that causes increased pressure on a nerve in the wrist (carpal tunnel syndrome).  Wear and tear of the joints that occurs with aging (osteoarthritis).  A variety of other types of arthritis.  Sometimes, the cause of wrist pain is not known. Often, the pain goes away when you follow instructions from your health care provider for relieving pain at home, such as resting or icing the wrist. If your wrist pain continues, it is important to tell your health care provider. Follow these instructions at home:  Rest the wrist area for at least 48 hours or as long as told by your health care provider.  If a splint or elastic bandage has been applied, use it as told by your health care provider. ? Remove the splint or bandage only as told by your health care provider. ? Loosen the splint or bandage if your fingers tingle, become numb, or turn cold or blue.  If directed, apply ice to the injured area. ? If you have a removable splint or elastic  bandage, remove it as told by your health care provider. ? Put ice in a plastic bag. ? Place a towel between your skin and the bag or between your splint or bandage and the bag. ? Leave the ice on for 20 minutes, 2-3 times a day.  Keep your arm raised (elevated) above the level of your heart while you are sitting or lying down.  Take over-the-counter and prescription medicines only as told by your health care provider.  Keep all follow-up visits as told by your health care provider. This is important. Contact a health care provider if:  You have a sudden sharp pain in the wrist, hand, or arm that is different or new.  The swelling or bruising on your wrist or hand gets worse.  Your skin becomes red, gets a rash,  or has open sores.  Your pain does not get better or it gets worse. Get help right away if:  You lose feeling in your fingers or hand.  Your fingers turn white, very red, or cold and blue.  You cannot move your fingers.  You have a fever or chills. This information is not intended to replace advice given to you by your health care provider. Make sure you discuss any questions you have with your health care provider. Document Released: 11/04/2004 Document Revised: 08/21/2015 Document Reviewed: 08/14/2015 Elsevier Interactive Patient Education  [employer].

## 2018-01-11 NOTE — Progress Notes (Signed)
Subjective:    Patient ID: Charles Houston, male    DOB: 11/19/1950, 67 y.o.   MRN: [id number]  No chief complaint on file.   HPI Patient was seen today for ongoing concern.  B/l wrist pain: -pt notes pain x a few wks -pt's chiropractor suggested wearing wrist splints -wearing wrist splints day and night -Patient unable to open jars or bottles. -Has not been able to cook or work out 2/2 pain.  -Endorses muscle atrophy as unable to lift weights/do push ups -Taking arthritis strength Tylenol 1000 mg twice daily -X-ray left hand with OA at first carpometacarpal joint  12/29/17  L Sciatic pain: -endorses improvement in pain since last OFV -May use Flexeril as needed at night   History reviewed. No pertinent past medical history.  No Known Allergies  ROS General: Denies fever, chills, night sweats, changes in weight, changes in appetite HEENT: Denies headaches, ear pain, changes in vision, rhinorrhea, sore throat CV: Denies CP, palpitations, SOB, orthopnea Pulm: Denies SOB, cough, wheezing GI: Denies abdominal pain, nausea, vomiting, diarrhea, constipation GU: Denies dysuria, hematuria, frequency, vaginal discharge Msk: Denies muscle cramps, joint pains  +b/l wrist pain and shoulder pain Neuro: Denies weakness, numbness, tingling Skin: Denies rashes, bruising Psych: Denies depression, anxiety, hallucinations     Objective:    Blood pressure 110/78, pulse (!) 104, temperature 98 F (36.7 C), temperature source Oral, weight 173 lb (78.5 kg), SpO2 96 %.  Gen. Pleasant, well-nourished, in no distress, normal affect   HEENT: [place]/AT, face symmetric, no scleral icterus, PERRLA, nares patent without drainage Lungs: no accessory muscle use, CTAB, no wheezes or rales Cardiovascular: RRR, no m/r/g, no peripheral edema Musculoskeletal: No edema, erythema, or tenderness of bilateral wrist.  Negative Tinel and Phalen's.  No numbness or tingling with tapping of epicondyles bilaterally.   Pain with flexion, extension, lateral movement of wrist bilaterally.  No TTP of b/l shoulders and clavicles.  Limited active and passive ROM of right shoulder.  +crossbody of R shoulder and empty can, unable to put hands behind back 2/2 pain. R shoulder pain >L shoulder pain.  No deformities, no cyanosis or clubbing, normal tone Neuro:  A&Ox3, CN II-XII intact, normal gait Skin:  Warm, no lesions/ rash  Wt Readings from Last 3 Encounters:  01/11/18 173 lb (78.5 kg)  11/03/17 173 lb (78.5 kg)  07/25/17 176 lb (79.8 kg)    Lab Results  Component Value Date   WBC 5.8 07/25/2017   HGB 14.4 07/25/2017   HCT 41.5 07/25/2017   PLT 285.0 07/25/2017   GLUCOSE 96 07/25/2017   CHOL 165 12/29/2017   TRIG 69.0 12/29/2017   HDL 67.20 12/29/2017   LDLCALC 84 12/29/2017   NA 141 07/25/2017   K 4.3 07/25/2017   CL 103 07/25/2017   CREATININE 0.84 07/25/2017   BUN 18 07/25/2017   CO2 28 07/25/2017   HGBA1C 5.6 07/25/2017    Assessment/Plan:  Primary osteoarthritis of left wrist -ok to continue Tylenol prn, heat, rest. -given handout -can wear braces at night.  Limit heavy lifting  - Plan: predniSONE (DELTASONE) 10 MG tablet  Right wrist pain  -likely 2/2 arthritis given h/o weight lifting and pt's work in [employer].  Discussed other possible causes including carpal tunnel, fracture, tendonitis, etc. -will wait to obtain xrays. -ok to use Tylenol prn, heat, rest - Plan: predniSONE (DELTASONE) 10 MG tablet  Acute pain of both shoulders -discussed various causes including impingement, deconditioning from limiting use due to wrist pain -  if pain continues after prednisone consider imaging.  F/u prn in 1 month, sooner if needed.  Grier Mitts, MD

## 2018-03-13 ENCOUNTER — Other Ambulatory Visit: Payer: Self-pay | Admitting: Urology

## 2018-03-13 DIAGNOSIS — C61 Malignant neoplasm of prostate: Secondary | ICD-10-CM

## 2018-04-06 ENCOUNTER — Ambulatory Visit
Admission: RE | Admit: 2018-04-06 | Discharge: 2018-04-06 | Disposition: A | Discharge status: Home / Self Care | Payer: Medicare Other | Source: Ambulatory Visit | Attending: Urology | Admitting: Urology

## 2018-04-06 DIAGNOSIS — C61 Malignant neoplasm of prostate: Secondary | ICD-10-CM

## 2018-04-06 DIAGNOSIS — R972 Elevated prostate specific antigen [PSA]: Secondary | ICD-10-CM | POA: Diagnosis not present

## 2018-04-06 DIAGNOSIS — N4289 Other specified disorders of prostate: Secondary | ICD-10-CM | POA: Diagnosis not present

## 2018-04-06 MED ORDER — GADOBENATE DIMEGLUMINE 529 MG/ML IV SOLN
16.0000 mL | Freq: Once | INTRAVENOUS | Status: AC | PRN
  Administered 2018-04-06: 16 mL via INTRAVENOUS

## 2018-05-10 DIAGNOSIS — C61 Malignant neoplasm of prostate: Secondary | ICD-10-CM | POA: Diagnosis not present

## 2018-05-17 DIAGNOSIS — C61 Malignant neoplasm of prostate: Secondary | ICD-10-CM | POA: Diagnosis not present

## 2018-11-30 DIAGNOSIS — N5201 Erectile dysfunction due to arterial insufficiency: Secondary | ICD-10-CM | POA: Diagnosis not present

## 2018-11-30 DIAGNOSIS — C61 Malignant neoplasm of prostate: Secondary | ICD-10-CM | POA: Diagnosis not present

## 2019-03-22 DIAGNOSIS — N5201 Erectile dysfunction due to arterial insufficiency: Secondary | ICD-10-CM | POA: Diagnosis not present

## 2019-03-22 DIAGNOSIS — C61 Malignant neoplasm of prostate: Secondary | ICD-10-CM | POA: Diagnosis not present

## 2019-05-07 ENCOUNTER — Ambulatory Visit: Payer: Medicare Other | Attending: Internal Medicine

## 2019-05-07 DIAGNOSIS — Z23 Encounter for immunization: Secondary | ICD-10-CM

## 2019-05-07 NOTE — Progress Notes (Signed)
   Covid-19 Vaccination Clinic  Name:  [name]    MRN: [id number] DOB: [birth year]  05/07/2019  Mr. Charles Houston was observed post Covid-19 immunization for 15 minutes without incident. He was provided with Vaccine Information Sheet and instruction to access the V-Safe system.   Mr. Charles Houston was instructed to call 911 with any severe reactions post vaccine: Marland Kitchen Difficulty breathing  . Swelling of face and throat  . A fast heartbeat  . A bad rash all over body  . Dizziness and weakness   Immunizations Administered    Name Date Dose VIS Date Route   Pfizer COVID-19 Vaccine 05/07/2019  3:12 PM 0.3 mL 01/19/2019 Intramuscular   Manufacturer: [place]   Lot: [id number]   [place]: [id number]

## 2019-05-30 ENCOUNTER — Ambulatory Visit: Payer: Medicare Other | Attending: Internal Medicine

## 2019-05-30 DIAGNOSIS — Z23 Encounter for immunization: Secondary | ICD-10-CM

## 2019-05-30 NOTE — Progress Notes (Signed)
   Covid-19 Vaccination Clinic  Name:  Charles Houston    MRN: [id number] DOB: 1950-07-21  05/30/2019  Mr. Charles Houston was observed post Covid-19 immunization for 15 minutes without incident. He was provided with Vaccine Information Sheet and instruction to access the V-Safe system.   Mr. Charles Houston was instructed to call 911 with any severe reactions post vaccine: Marland Kitchen Difficulty breathing  . Swelling of face and throat  . A fast heartbeat  . A bad rash all over body  . Dizziness and weakness   Immunizations Administered    Name Date Dose VIS Date Route   Pfizer COVID-19 Vaccine 05/30/2019  2:25 PM 0.3 mL 04/04/2018 Intramuscular   Manufacturer: [place]   Lot: [id number]   [place]: [id number]

## 2019-06-14 ENCOUNTER — Other Ambulatory Visit: Payer: Self-pay

## 2019-06-14 ENCOUNTER — Ambulatory Visit (INDEPENDENT_AMBULATORY_CARE_PROVIDER_SITE_OTHER): Payer: BLUE CROSS/BLUE SHIELD | Admitting: Physician Assistant

## 2019-06-14 ENCOUNTER — Encounter: Payer: Self-pay | Admitting: Physician Assistant

## 2019-06-14 DIAGNOSIS — Z1283 Encounter for screening for malignant neoplasm of skin: Secondary | ICD-10-CM | POA: Diagnosis not present

## 2019-06-14 DIAGNOSIS — C44319 Basal cell carcinoma of skin of other parts of face: Secondary | ICD-10-CM

## 2019-06-14 DIAGNOSIS — C4491 Basal cell carcinoma of skin, unspecified: Secondary | ICD-10-CM

## 2019-06-14 DIAGNOSIS — L821 Other seborrheic keratosis: Secondary | ICD-10-CM

## 2019-06-14 DIAGNOSIS — D485 Neoplasm of uncertain behavior of skin: Secondary | ICD-10-CM

## 2019-06-14 DIAGNOSIS — L570 Actinic keratosis: Secondary | ICD-10-CM | POA: Diagnosis not present

## 2019-06-14 HISTORY — DX: Basal cell carcinoma of skin, unspecified: C44.91

## 2019-06-14 NOTE — Patient Instructions (Signed)

## 2019-06-14 NOTE — Progress Notes (Addendum)
   Follow-Up Visit   Subjective  Charles Houston is a 69 y.o. male who presents for the following: Annual Exam (forehead crust x4 months,  he picked and it + bleed. No history of NMSC or DN. No treatment. Persistent. Also has a raised mole on back seen at last visit no issues). Few brown crusts on back and face. No symptoms.  The following portions of the chart were reviewed this encounter and updated as appropriate: Tobacco  Allergies  Meds  Problems  Med Hx  Surg Hx  Fam Hx      Objective  Well appearing patient in no apparent distress; mood and affect are within normal limits.  All skin waist up examined.  Objective  Left Malar Cheek, Left Upper Back, Mid Back, Mid Upper Vermilion Lip: Erythematous patches with gritty scale.  Objective  Mid Forehead: Pearly papule with telangectasia.  7cm to left brow 6.5 to right brow     Objective  Mid Back: No DN  Objective  Left Forehead, Right Temple: Stuck-on, waxy, tan-brown papules and plaques. --Discussed benign etiology and prognosis.   Assessment & Plan  AK (actinic keratosis) (4) Left Upper Back; Mid Back; Mid Upper Vermilion Lip; Left Malar Cheek  Destruction of lesion - Left Malar Cheek, Left Upper Back, Mid Back, Mid Upper [place] Lip Complexity: simple   Destruction method: cryotherapy   Informed consent: discussed and consent obtained   Timeout:  patient name, date of birth, surgical site, and procedure verified Lesion destroyed using liquid nitrogen: Yes   Cryotherapy cycles:  1 Outcome: patient tolerated procedure well with no complications   Post-procedure details: wound care instructions given    Basal cell carcinoma (BCC), unspecified site Mid Forehead  Skin / nail biopsy Type of biopsy: tangential   Informed consent: discussed and consent obtained   Timeout: patient name, date of birth, surgical site, and procedure verified   Anesthesia: the lesion was anesthetized in a standard fashion     Anesthetic:  1% lidocaine w/ epinephrine 1-100,000 local infiltration Instrument used: flexible razor blade   Hemostasis achieved with: aluminum chloride and electrodesiccation   Outcome: patient tolerated procedure well   Post-procedure details: wound care instructions given    Specimen 1 - Surgical pathology Differential Diagnosis: BCC Check Margins: No  Refer to Mohs  Screening exam for skin cancer Mid Back  Yearly skin exam  Seborrheic keratosis (2) Right Temple; Left Forehead  observe

## 2019-06-21 ENCOUNTER — Encounter: Payer: Self-pay | Admitting: *Deleted

## 2019-06-21 ENCOUNTER — Telehealth: Payer: Self-pay | Admitting: *Deleted

## 2019-06-21 NOTE — Telephone Encounter (Signed)
Path to patient will refer patient to skin surgery center for mohs.

## 2020-03-20 IMAGING — DX DG HAND 2V*L*
2 series · 2 of 2 positions shown · non-contrast
Comparison: None.

CLINICAL DATA: History of a left hand fracture. Questionable
surgical pen.

EXAM:
LEFT HAND - 2 VIEW

[hand pa]
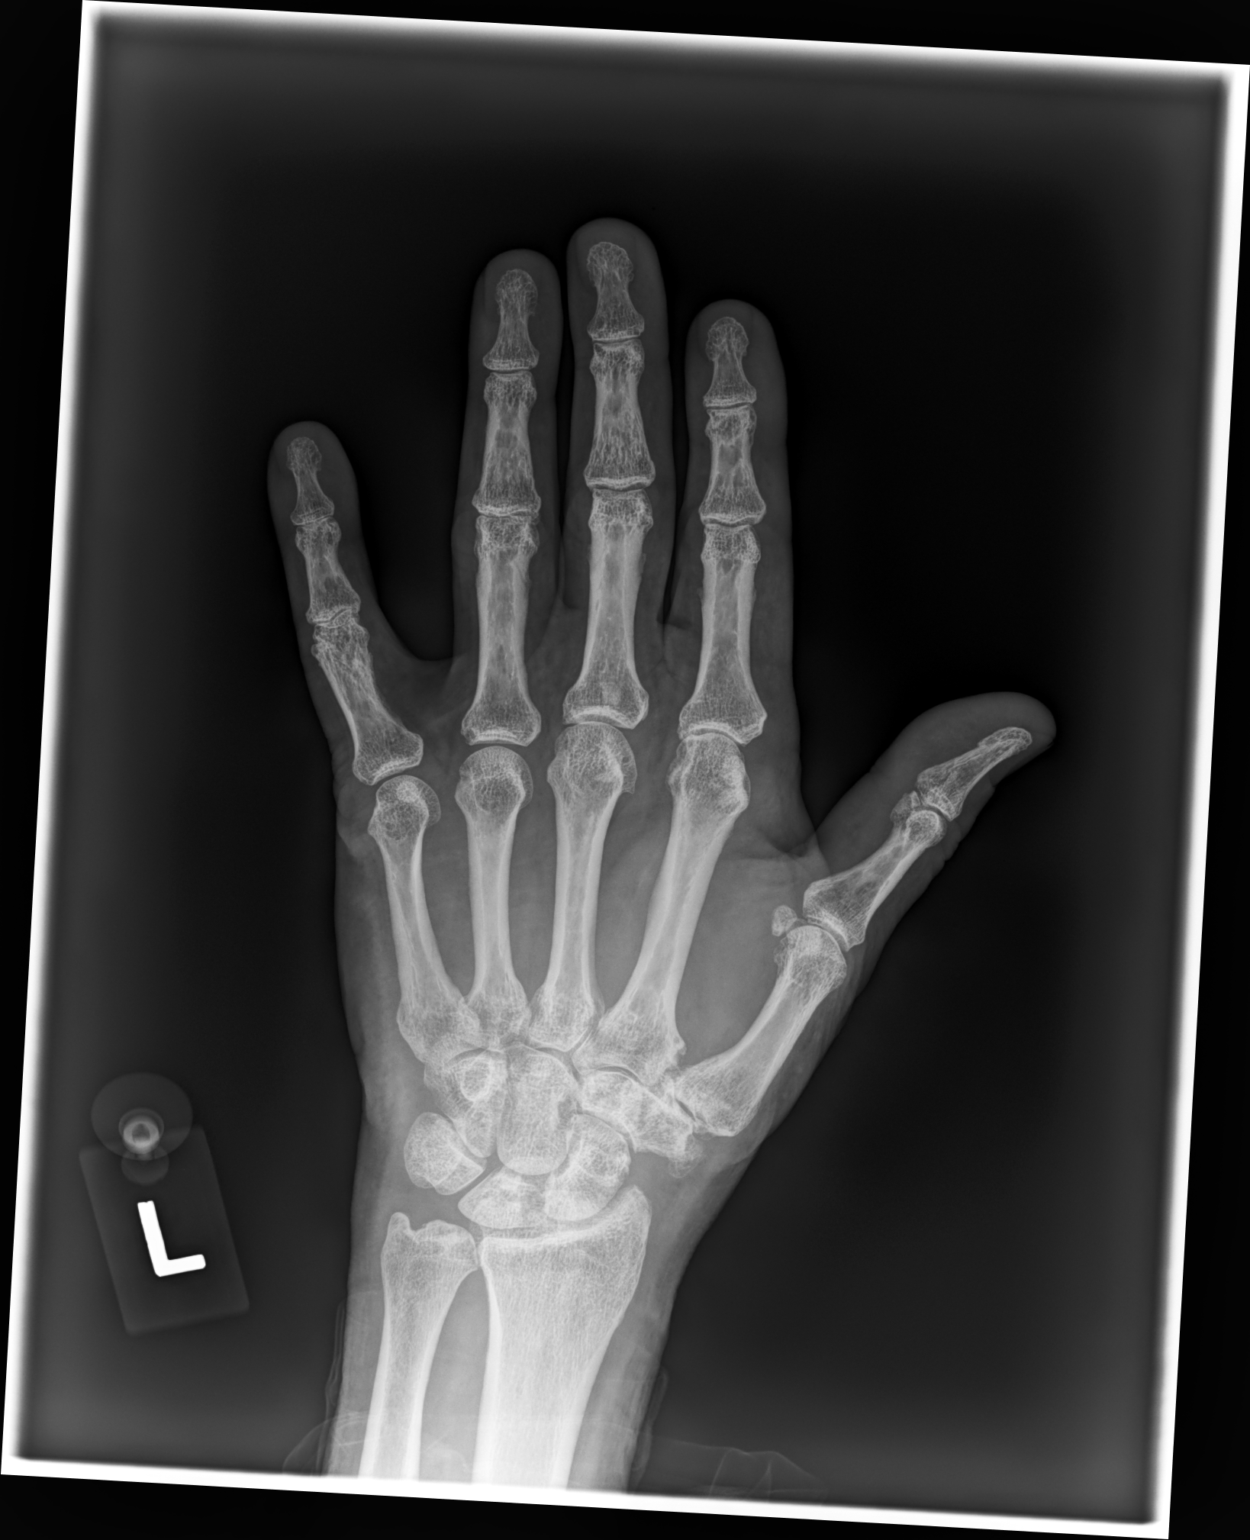

[hand lat]
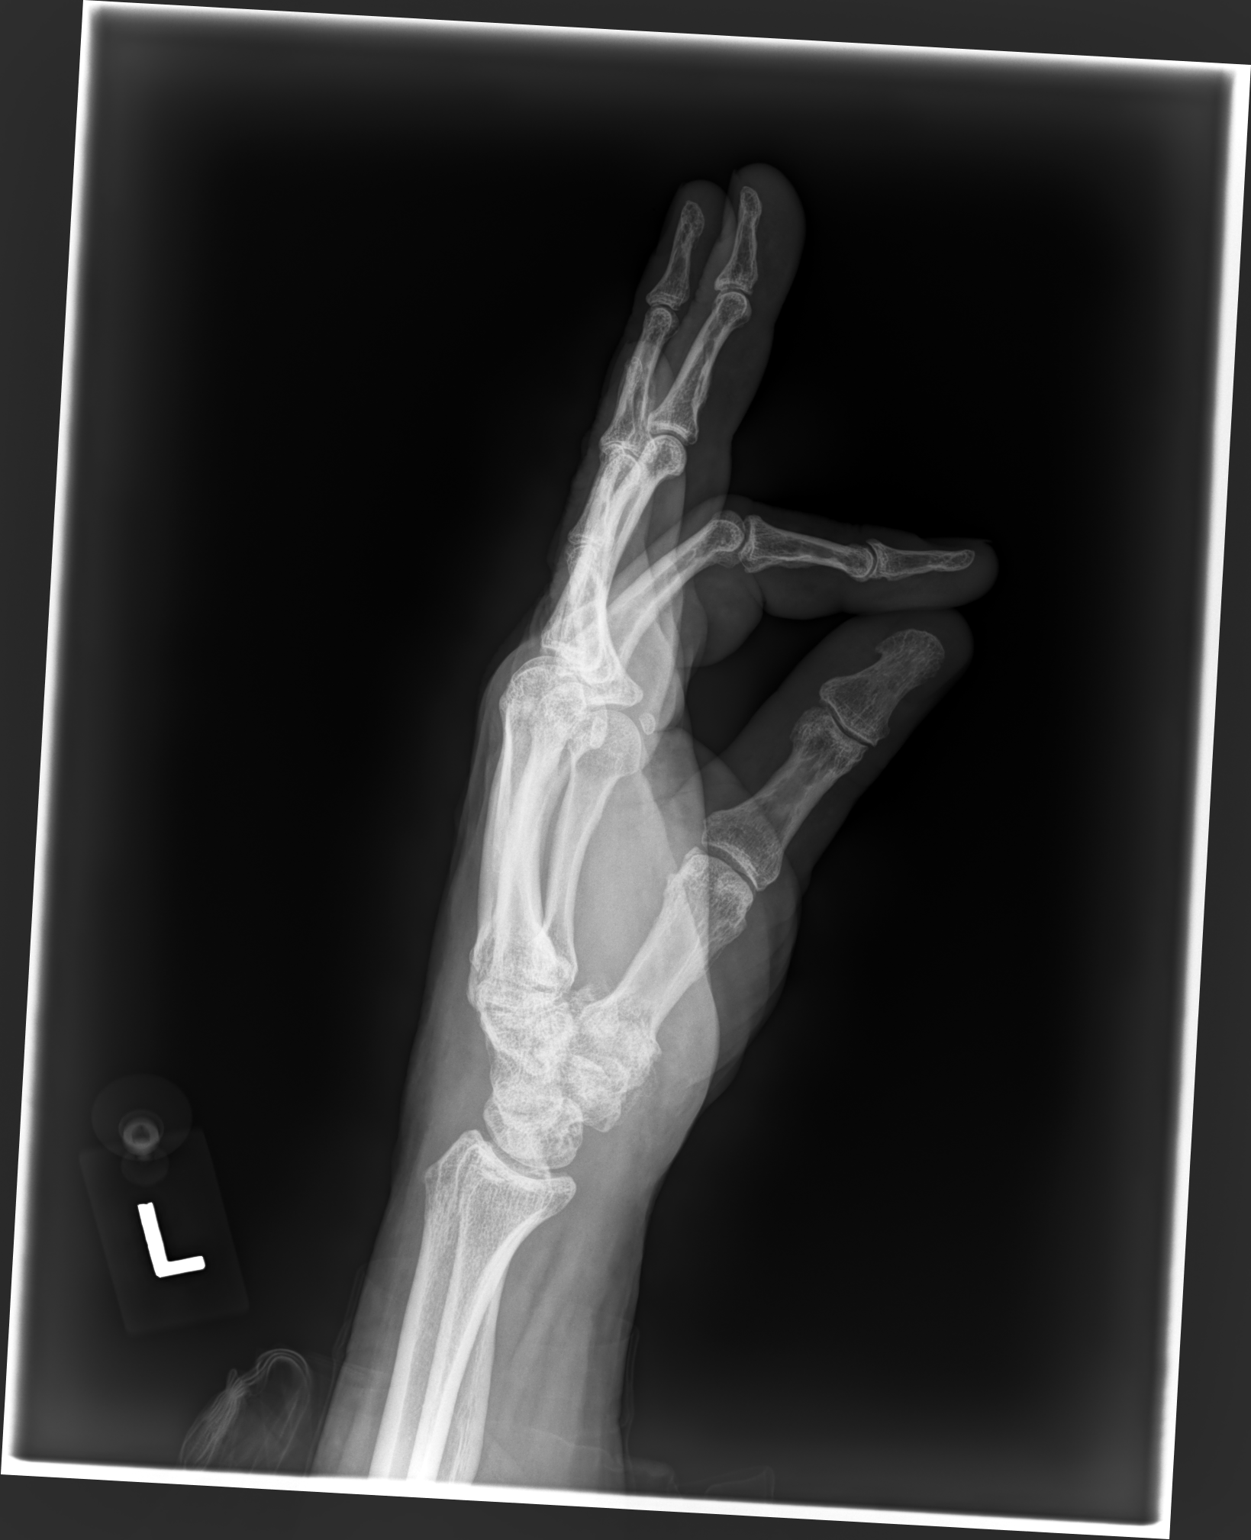

[2 of 2 positions shown; findings below may reference images not displayed]

FINDINGS: No fracture. No bone lesion. No surgical pen or other radiopaque
foreign body.

Narrowing, subchondral sclerosis and marginal osteophytes noted at
the trapezium first metacarpal articulation consistent with
osteoarthritis. Mild asymmetric joint space narrowing noted of
several of the interphalangeal joints. No other degenerative change.

Soft tissues are unremarkable.
IMPRESSION: 1. No acute fracture or bone lesion.  No orthopedic hardware.
2. First carpometacarpal articulation osteoarthritis.

## 2020-03-26 NOTE — Progress Notes (Signed)
Subjective:   Charles Houston is a 70 y.o. male who presents for an Initial Medicare Annual Wellness Visit.  Review of Systems    N/A  Cardiac Risk Factors include: advanced age (>63men, >64 women);male gender     Objective:    Today's Vitals   03/27/20 0830  BP: 140/78  Pulse: 85  Temp: 98 F (36.7 C)  TempSrc: Oral  SpO2: 97%  Weight: 182 lb 9 oz (82.8 kg)  Height: 5\' 7"  (1.702 m)   Body mass index is 28.59 kg/m.  Advanced Directives 03/27/2020  Does Patient Have a Medical Advance Directive? No  Would patient like information on creating a medical advance directive? No - Patient declined    Current Medications (verified) Outpatient Encounter Medications as of 03/27/2020  Medication Sig  . [DISCONTINUED] cyclobenzaprine (FLEXERIL) 5 MG tablet Take 1 tablet (5 mg total) by mouth 3 (three) times daily as needed for muscle spasms.  . [DISCONTINUED] predniSONE (DELTASONE) 10 MG tablet Take 4 tabs every morning for 3 days, 3 tabs for 2 days, 2 tabs for 2 days, 1 tab for 1 day. (Patient not taking: Reported on 06/14/2019)   No facility-administered encounter medications on file as of 03/27/2020.    Allergies (verified) Patient has no known allergies.   History: Past Medical History:  Diagnosis Date  . Basal cell carcinoma 06/14/2019   mid forehead([facility])  . Prostate cancer [facility])    History reviewed. No pertinent surgical history. No family history on file. Social History   Socioeconomic History  . Marital status: Divorced    Spouse name: Not on file  . Number of children: Not on file  . Years of education: Not on file  . Highest education level: Not on file  Occupational History  . Not on file  Tobacco Use  . Smoking status: Never Smoker  . Smokeless tobacco: Never Used  Substance and Sexual Activity  . Alcohol use: Yes  . Drug use: Never  . Sexual activity: Not on file  Other Topics Concern  . Not on file  Social History Narrative  . Not on file    Social Determinants of Health   Financial Resource Strain: Low Risk   . Difficulty of Paying Living Expenses: Not hard at all  Food Insecurity: No Food Insecurity  . Worried About Charity fundraiser in the Last Year: Never true  . Ran Out of Food in the Last Year: Never true  Transportation Needs: No Transportation Needs  . Lack of Transportation (Medical): No  . Lack of Transportation (Non-Medical): No  Physical Activity: Inactive  . Days of Exercise per Week: 0 days  . Minutes of Exercise per Session: 0 min  Stress: No Stress Concern Present  . Feeling of Stress : Not at all  Social Connections: Moderately Integrated  . Frequency of Communication with Friends and Family: More than three times a week  . Frequency of Social Gatherings with Friends and Family: Once a week  . Attends Religious Services: More than 4 times per year  . Active Member of Clubs or Organizations: Yes  . Attends Archivist Meetings: More than 4 times per year  . Marital Status: Divorced    Tobacco Counseling Counseling given: Not Answered   Clinical Intake:  Pre-visit preparation completed: Yes  Pain : No/denies pain     Nutritional Risks: None Diabetes: No  How often do you need to have someone help you when you read instructions, pamphlets, or other  written materials from your doctor or pharmacy?: 1 - Never  Diabetic?No   Interpreter Needed?: No  Information entered by :: [place] of Daily Living In your present state of health, do you have any difficulty performing the following activities: 03/27/2020  Hearing? Y  Vision? N  Difficulty concentrating or making decisions? N  Walking or climbing stairs? N  Dressing or bathing? N  Doing errands, shopping? N  Preparing Food and eating ? N  Using the Toilet? N  In the past six months, have you accidently leaked urine? N  Do you have problems with loss of bowel control? N  Managing your Medications? N   Managing your Finances? N  Housekeeping or managing your Housekeeping? N  Some recent data might be hidden    Patient Care Team: Billie Ruddy, MD as PCP - General (Family Medicine)  Indicate any recent Medical Services you may have received from other than [facility] providers in the past year (date may be approximate).     Assessment:   This is a routine wellness examination for [name].  Hearing/Vision screen  Hearing Screening   125Hz  250Hz  500Hz  1000Hz  2000Hz  3000Hz  4000Hz  6000Hz  8000Hz   Right ear:           Left ear:           Vision Screening Comments: States has not had eyes examined in several years   Dietary issues and exercise activities discussed: Current Exercise Habits: The patient has a physically strenuous job, but has no regular exercise apart from work., Exercise limited by: None identified  Goals    . Patient Stated     I plan to go to [place] when I retire!       Depression Screen PHQ 2/9 Scores 03/27/2020  PHQ - 2 Score 0    Fall Risk Fall Risk  03/27/2020  Falls in the past year? 0  Number falls in past yr: 0  Injury with Fall? 0  Risk for fall due to : No Fall Risks  Follow up Falls prevention discussed;Falls evaluation completed    FALL RISK PREVENTION PERTAINING TO THE HOME:  Any stairs in or around the home? No  If so, are there any without handrails? No  Home free of loose throw rugs in walkways, pet beds, electrical cords, etc? Yes  Adequate lighting in your home to reduce risk of falls? Yes   ASSISTIVE DEVICES UTILIZED TO PREVENT FALLS:  Life alert? No  Use of a cane, walker or w/c? No  Grab bars in the bathroom? No  Shower chair or bench in shower? No  Elevated toilet seat or a handicapped toilet? No   TIMED UP AND GO:  Was the test performed? Yes .  Length of time to ambulate 10 feet: 3 sec.   Gait steady and fast without use of assistive device  Cognitive Function:        Immunizations Immunization History   Administered Date(s) Administered  . PFIZER(Purple Top)SARS-COV-2 Vaccination 05/07/2019, 05/30/2019  . Zoster Recombinat (Shingrix) 03/27/2020    TDAP status: Due, Education has been provided regarding the importance of this vaccine. Advised may receive this vaccine at local pharmacy or Health Dept. Aware to provide a copy of the vaccination record if obtained from local pharmacy or Health Dept. Verbalized acceptance and understanding.  Flu Vaccine status: Declined, Education has been provided regarding the importance of this vaccine but patient still declined. Advised may receive this vaccine at local pharmacy or Health  Dept. Aware to provide a copy of the vaccination record if obtained from local pharmacy or Health Dept. Verbalized acceptance and understanding.  Pneumococcal vaccine status: Declined,  Education has been provided regarding the importance of this vaccine but patient still declined. Advised may receive this vaccine at local pharmacy or Health Dept. Aware to provide a copy of the vaccination record if obtained from local pharmacy or Health Dept. Verbalized acceptance and understanding.   Covid-19 vaccine status: Completed vaccines  Qualifies for Shingles Vaccine? Yes   Zostavax completed No   Shingrix Completed?: No.    Education has been provided regarding the importance of this vaccine. Patient has been advised to call insurance company to determine out of pocket expense if they have not yet received this vaccine. Advised may also receive vaccine at local pharmacy or Health Dept. Verbalized acceptance and understanding.  Screening Tests Health Maintenance  Topic Date Due  . Hepatitis C Screening  Never done  . TETANUS/TDAP  Never done  . COLONOSCOPY (Pts 45-12yrs Insurance coverage will need to be confirmed)  Never done  . PNA vac Low Risk Adult (1 of 2 - PCV13) Never done  . COVID-19 Vaccine (3 - Pfizer risk 4-dose series) 06/27/2019  . INFLUENZA VACCINE  Never done     Health Maintenance  Health Maintenance Due  Topic Date Due  . Hepatitis C Screening  Never done  . TETANUS/TDAP  Never done  . COLONOSCOPY (Pts 45-25yrs Insurance coverage will need to be confirmed)  Never done  . PNA vac Low Risk Adult (1 of 2 - PCV13) Never done  . COVID-19 Vaccine (3 - Pfizer risk 4-dose series) 06/27/2019  . INFLUENZA VACCINE  Never done    Colorectal Cancer Screening: currently due, patient is moving back to [place] and plans to get colonoscopy there   Lung Cancer Screening: (Low Dose CT Chest recommended if Age 12-80 years, 30 pack-year currently smoking OR have quit w/in 15years.) does not qualify.   Lung Cancer Screening Referral: N/A   Additional Screening:  Hepatitis C Screening: does qualify;   Vision Screening: Recommended annual ophthalmology exams for early detection of glaucoma and other disorders of the eye. Is the patient up to date with their annual eye exam?  No  Who is the provider or what is the name of the office in which the patient attends annual eye exams? Does not have an eye doctor. Moving to [facility] in a few weeks will establish there If pt is not established with a provider, would they like to be referred to a provider to establish care? No .   Dental Screening: Recommended annual dental exams for proper oral hygiene  Community Resource Referral / Chronic Care Management: CRR required this visit?  No   CCM required this visit?  No      Plan:     I have personally reviewed and noted the following in the patient's chart:   . Medical and social history . Use of alcohol, tobacco or illicit drugs  . Current medications and supplements . Functional ability and status . Nutritional status . Physical activity . Advanced directives . List of other physicians . Hospitalizations, surgeries, and ER visits in previous 12 months . Vitals . Screenings to include cognitive, depression, and falls . Referrals and  appointments  In addition, I have reviewed and discussed with patient certain preventive protocols, quality metrics, and best practice recommendations. A written personalized care plan for preventive services as well as general preventive  health recommendations were provided to patient.     Ofilia Neas, LPN   [id number]   Nurse Notes: None

## 2020-03-27 ENCOUNTER — Ambulatory Visit (INDEPENDENT_AMBULATORY_CARE_PROVIDER_SITE_OTHER): Payer: BLUE CROSS/BLUE SHIELD | Admitting: Family Medicine

## 2020-03-27 ENCOUNTER — Ambulatory Visit (INDEPENDENT_AMBULATORY_CARE_PROVIDER_SITE_OTHER): Payer: BLUE CROSS/BLUE SHIELD

## 2020-03-27 ENCOUNTER — Encounter: Payer: Self-pay | Admitting: Family Medicine

## 2020-03-27 ENCOUNTER — Other Ambulatory Visit: Payer: Self-pay

## 2020-03-27 VITALS — BP 140/78 | HR 85 | Temp 98.0°F | Ht 67.0 in | Wt 182.6 lb

## 2020-03-27 VITALS — BP 140/80 | HR 85 | Temp 98.0°F | Ht 67.0 in | Wt 182.9 lb

## 2020-03-27 DIAGNOSIS — Z0000 Encounter for general adult medical examination without abnormal findings: Secondary | ICD-10-CM | POA: Diagnosis not present

## 2020-03-27 DIAGNOSIS — E782 Mixed hyperlipidemia: Secondary | ICD-10-CM

## 2020-03-27 DIAGNOSIS — Z23 Encounter for immunization: Secondary | ICD-10-CM

## 2020-03-27 DIAGNOSIS — Z7141 Alcohol abuse counseling and surveillance of alcoholic: Secondary | ICD-10-CM | POA: Diagnosis not present

## 2020-03-27 DIAGNOSIS — Z1159 Encounter for screening for other viral diseases: Secondary | ICD-10-CM

## 2020-03-27 DIAGNOSIS — C61 Malignant neoplasm of prostate: Secondary | ICD-10-CM | POA: Diagnosis not present

## 2020-03-27 LAB — CBC WITH DIFFERENTIAL/PLATELET
Basophils Absolute: 0 10*3/uL (ref 0.0–0.1)
Basophils Relative: 0.5 % (ref 0.0–3.0)
Eosinophils Absolute: 0.2 10*3/uL (ref 0.0–0.7)
Eosinophils Relative: 4.1 % (ref 0.0–5.0)
HCT: 39.5 % (ref 39.0–52.0)
Hemoglobin: 13.6 g/dL (ref 13.0–17.0)
Lymphocytes Relative: 24.6 % (ref 12.0–46.0)
Lymphs Abs: 1.3 10*3/uL (ref 0.7–4.0)
MCHC: 34.4 g/dL (ref 30.0–36.0)
MCV: 96.5 fl (ref 78.0–100.0)
Monocytes Absolute: 0.4 10*3/uL (ref 0.1–1.0)
Monocytes Relative: 7.4 % (ref 3.0–12.0)
Neutro Abs: 3.2 10*3/uL (ref 1.4–7.7)
Neutrophils Relative %: 63.4 % (ref 43.0–77.0)
Platelets: 306 10*3/uL (ref 150.0–400.0)
RBC: 4.1 Mil/uL — ABNORMAL LOW (ref 4.22–5.81)
RDW: 13 % (ref 11.5–15.5)
WBC: 5.1 10*3/uL (ref 4.0–10.5)

## 2020-03-27 LAB — COMPREHENSIVE METABOLIC PANEL
ALT: 19 U/L (ref 0–53)
AST: 20 U/L (ref 0–37)
Albumin: 4.7 g/dL (ref 3.5–5.2)
Alkaline Phosphatase: 69 U/L (ref 39–117)
BUN: 17 mg/dL (ref 6–23)
CO2: 28 mEq/L (ref 19–32)
Calcium: 9.7 mg/dL (ref 8.4–10.5)
Chloride: 105 mEq/L (ref 96–112)
Creatinine, Ser: 0.75 mg/dL (ref 0.40–1.50)
GFR: 93.34 mL/min (ref >60.00)
Glucose, Bld: 86 mg/dL (ref 70–99)
Potassium: 4.4 mEq/L (ref 3.5–5.1)
Sodium: 140 mEq/L (ref 135–145)
Total Bilirubin: 0.6 mg/dL (ref 0.2–1.2)
Total Protein: 7.5 g/dL (ref 6.0–8.3)

## 2020-03-27 LAB — LIPID PANEL
Cholesterol: 237 mg/dL — ABNORMAL HIGH (ref 0–200)
HDL: 88.2 mg/dL (ref >39.00)
LDL Cholesterol: 128 mg/dL — ABNORMAL HIGH (ref 0–99)
NonHDL: 148.98
Total CHOL/HDL Ratio: 3
Triglycerides: 107 mg/dL (ref 0.0–149.0)
VLDL: 21.4 mg/dL (ref 0.0–40.0)

## 2020-03-27 LAB — PSA: PSA: 8.97 ng/mL — ABNORMAL HIGH (ref 0.10–4.00)

## 2020-03-27 NOTE — Patient Instructions (Signed)
Health Maintenance After Age 68 After age 64, you are at a higher risk for certain long-term diseases and infections as well as injuries from falls. Falls are a major cause of broken bones and head injuries in people who are older than age 41. Getting regular preventive care can help to keep you healthy and well. Preventive care includes getting regular testing and making lifestyle changes as recommended by your health care provider. Talk with your health care provider about:  Which screenings and tests you should have. A screening is a test that checks for a disease when you have no symptoms.  A diet and exercise plan that is right for you. What should I know about screenings and tests to prevent falls? Screening and testing are the best ways to find a health problem early. Early diagnosis and treatment give you the best chance of managing medical conditions that are common after age 19. Certain conditions and lifestyle choices may make you more likely to have a fall. Your health care provider may recommend:  Regular vision checks. Poor vision and conditions such as cataracts can make you more likely to have a fall. If you wear glasses, make sure to get your prescription updated if your vision changes.  Medicine review. Work with your health care provider to regularly review all of the medicines you are taking, including over-the-counter medicines. Ask your health care provider about any side effects that may make you more likely to have a fall. Tell your health care provider if any medicines that you take make you feel dizzy or sleepy.  Osteoporosis screening. Osteoporosis is a condition that causes the bones to get weaker. This can make the bones weak and cause them to break more easily.  Blood pressure screening. Blood pressure changes and medicines to control blood pressure can make you feel dizzy.  Strength and balance checks. Your health care provider may recommend certain tests to check your  strength and balance while standing, walking, or changing positions.  Foot health exam. Foot pain and numbness, as well as not wearing proper footwear, can make you more likely to have a fall.  Depression screening. You may be more likely to have a fall if you have a fear of falling, feel emotionally low, or feel unable to do activities that you used to do.  Alcohol use screening. Using too much alcohol can affect your balance and may make you more likely to have a fall. What actions can I take to lower my risk of falls? General instructions  Talk with your health care provider about your risks for falling. Tell your health care provider if: ? You fall. Be sure to tell your health care provider about all falls, even ones that seem minor. ? You feel dizzy, sleepy, or off-balance.  Take over-the-counter and prescription medicines only as told by your health care provider. These include any supplements.  Eat a healthy diet and maintain a healthy weight. A healthy diet includes low-fat dairy products, low-fat (lean) meats, and fiber from whole grains, beans, and lots of fruits and vegetables. Home safety  Remove any tripping hazards, such as rugs, cords, and clutter.  Install safety equipment such as grab bars in bathrooms and safety rails on stairs.  Keep rooms and walkways well-lit. Activity  Follow a regular exercise program to stay fit. This will help you maintain your balance. Ask your health care provider what types of exercise are appropriate for you.  If you need a cane or walker,  use it as recommended by your health care provider.  Wear supportive shoes that have nonskid soles.   Lifestyle  Do not drink alcohol if your health care provider tells you not to drink.  If you drink alcohol, limit how much you have: ? 0-1 drink a day for women. ? 0-2 drinks a day for men.  Be aware of how much alcohol is in your drink. In the [place] one drink equals one typical bottle of beer (12  oz), one-half glass of wine (5 oz), or one shot of hard liquor (1 oz).  Do not use any products that contain nicotine or tobacco, such as cigarettes and e-cigarettes. If you need help quitting, ask your health care provider. Summary  Having a healthy lifestyle and getting preventive care can help to protect your health and wellness after age 73.  Screening and testing are the best way to find a health problem early and help you avoid having a fall. Early diagnosis and treatment give you the best chance for managing medical conditions that are more common for people who are older than age 26.  Falls are a major cause of broken bones and head injuries in people who are older than age 23. Take precautions to prevent a fall at home.  Work with your health care provider to learn what changes you can make to improve your health and wellness and to prevent falls. This information is not intended to replace advice given to you by your health care provider. Make sure you discuss any questions you have with your health care provider. Document Revised: 05/18/2018 Document Reviewed: 12/08/2016 Elsevier Patient Education  2021 [employer].  Alcohol Withdrawal Syndrome When a person who drinks a lot of alcohol stops drinking, he or she may have unpleasant and serious symptoms. These symptoms are called alcohol withdrawal syndrome. This condition may be mild or severe. It can be life-threatening. It can cause:  Shaking that you cannot control (tremor).  Sweating.  Headache.  Feeling fearful, upset, grouchy, or depressed.  Trouble sleeping (insomnia).  Nightmares.  Fast or uneven heartbeats (palpitations).  Alcohol cravings.  Feeling sick to your stomach (nausea).  Throwing up (vomiting).  Being bothered by light and sounds.  Confusion.  Trouble thinking clearly.  Not being hungry (loss of appetite).  Big changes in mood (mood swings). If you have all of the following symptoms at  the same time, get help right away:  High blood pressure.  Fast heartbeat.  Trouble breathing.  Seizures.  Seeing, hearing, feeling, smelling, or tasting things that are not there (hallucinations). These symptoms are known as delirium tremens (DTs). They must be treated at the hospital right away. Follow these instructions at home:  Take over-the-counter and prescription medicines only as told by your doctor. This includes vitamins.  Do not drink alcohol.  Do not drive until your doctor says that this is safe for you.  Have someone stay with you or be available in case you need help. This should be someone you trust. This person can help you with your symptoms. He or she can also help you to not drink.  Drink enough fluid to keep your pee (urine) pale yellow.  Think about joining a support group or a treatment program to help you stop drinking.  Keep all follow-up visits as told by your doctor. This is important.   Contact a doctor if:  Your symptoms get worse.  You cannot eat or drink without throwing up.  You have  a hard time not drinking alcohol.  You cannot stop drinking alcohol. Get help right away if:  You have fast or uneven heartbeats.  You have chest pain.  You have trouble breathing.  You have a seizure for the first time.  You see, hear, feel, smell, or taste something that is not there.  You get very confused. Summary  When a person who drinks a lot of alcohol stops drinking, he or she may have serious symptoms. This is called alcohol withdrawal syndrome.  Delirium tremens (DTs) is a group of life-threatening symptoms. You should get help right away if you have these symptoms.  Think about joining an alcohol support group or a treatment program. This information is not intended to replace advice given to you by your health care provider. Make sure you discuss any questions you have with your health care provider. Document Revised: 01/07/2017  Document Reviewed: 10/01/2016 Elsevier Patient Education  2021 [employer].

## 2020-03-27 NOTE — Patient Instructions (Signed)
Charles Houston , Thank you for taking time to come for your Medicare Wellness Visit. I appreciate your ongoing commitment to your health goals. Please review the following plan we discussed and let me know if I can assist you in the future.   Screening recommendations/referrals: Colonoscopy: Currently due, please get scheduled once you move to [facility] Recommended yearly ophthalmology/optometry visit for glaucoma screening and checkup Recommended yearly dental visit for hygiene and checkup  Vaccinations: Influenza vaccine: Patient declined  Pneumococcal vaccine: Patient declined  Tdap vaccine: Patient declined Shingles vaccine: Up to date, next shingrix is due 05/25/2020     Advanced directives: Advance directive discussed with you today. Even though you declined this today please call our office should you change your mind and we can give you the proper paperwork for you to fill out.   Conditions/risks identified: none   Next appointment: None   Preventive Care 68 Years and Older, Male Preventive care refers to lifestyle choices and visits with your health care provider that can promote health and wellness. What does preventive care include?  A yearly physical exam. This is also called an annual well check.  Dental exams once or twice a year.  Routine eye exams. Ask your health care provider how often you should have your eyes checked.  Personal lifestyle choices, including:  Daily care of your teeth and gums.  Regular physical activity.  Eating a healthy diet.  Avoiding tobacco and drug use.  Limiting alcohol use.  Practicing safe sex.  Taking low doses of aspirin every day.  Taking vitamin and mineral supplements as recommended by your health care provider. What happens during an annual well check? The services and screenings done by your health care provider during your annual well check will depend on your age, overall health, lifestyle risk factors, and family history of  disease. Counseling  Your health care provider may ask you questions about your:  Alcohol use.  Tobacco use.  Drug use.  Emotional well-being.  Home and relationship well-being.  Sexual activity.  Eating habits.  History of falls.  Memory and ability to understand (cognition).  Work and work Statistician. Screening  You may have the following tests or measurements:  Height, weight, and BMI.  Blood pressure.  Lipid and cholesterol levels. These may be checked every 5 years, or more frequently if you are over 83 years old.  Skin check.  Lung cancer screening. You may have this screening every year starting at age 4 if you have a 30-pack-year history of smoking and currently smoke or have quit within the past 15 years.  Fecal occult blood test (FOBT) of the stool. You may have this test every year starting at age 80.  Flexible sigmoidoscopy or colonoscopy. You may have a sigmoidoscopy every 5 years or a colonoscopy every 10 years starting at age 55.  Prostate cancer screening. Recommendations will vary depending on your family history and other risks.  Hepatitis C blood test.  Hepatitis B blood test.  Sexually transmitted disease (STD) testing.  Diabetes screening. This is done by checking your blood sugar (glucose) after you have not eaten for a while (fasting). You may have this done every 1-3 years.  Abdominal aortic aneurysm (AAA) screening. You may need this if you are a current or former smoker.  Osteoporosis. You may be screened starting at age 78 if you are at high risk. Talk with your health care provider about your test results, treatment options, and if necessary, the need for  more tests. Vaccines  Your health care provider may recommend certain vaccines, such as:  Influenza vaccine. This is recommended every year.  Tetanus, diphtheria, and acellular pertussis (Tdap, Td) vaccine. You may need a Td booster every 10 years.  Zoster vaccine. You may  need this after age 24.  Pneumococcal 13-valent conjugate (PCV13) vaccine. One dose is recommended after age 43.  Pneumococcal polysaccharide (PPSV23) vaccine. One dose is recommended after age 47. Talk to your health care provider about which screenings and vaccines you need and how often you need them. This information is not intended to replace advice given to you by your health care provider. Make sure you discuss any questions you have with your health care provider. Document Released: 02/21/2015 Document Revised: 10/15/2015 Document Reviewed: 11/26/2014 Elsevier Interactive Patient Education  2017 [place] Prevention in the Home Falls can cause injuries. They can happen to people of all ages. There are many things you can do to make your home safe and to help prevent falls. What can I do on the outside of my home?  Regularly fix the edges of walkways and driveways and fix any cracks.  Remove anything that might make you trip as you walk through a door, such as a raised step or threshold.  Trim any bushes or trees on the path to your home.  Use bright outdoor lighting.  Clear any walking paths of anything that might make someone trip, such as rocks or tools.  Regularly check to see if handrails are loose or broken. Make sure that both sides of any steps have handrails.  Any raised decks and porches should have guardrails on the edges.  Have any leaves, snow, or ice cleared regularly.  Use sand or salt on walking paths during winter.  Clean up any spills in your garage right away. This includes oil or grease spills. What can I do in the bathroom?  Use night lights.  Install grab bars by the toilet and in the tub and shower. Do not use towel bars as grab bars.  Use non-skid mats or decals in the tub or shower.  If you need to sit down in the shower, use a plastic, non-slip stool.  Keep the floor dry. Clean up any water that spills on the floor as soon as it  happens.  Remove soap buildup in the tub or shower regularly.  Attach bath mats securely with double-sided non-slip rug tape.  Do not have throw rugs and other things on the floor that can make you trip. What can I do in the bedroom?  Use night lights.  Make sure that you have a light by your bed that is easy to reach.  Do not use any sheets or blankets that are too big for your bed. They should not hang down onto the floor.  Have a firm chair that has side arms. You can use this for support while you get dressed.  Do not have throw rugs and other things on the floor that can make you trip. What can I do in the kitchen?  Clean up any spills right away.  Avoid walking on wet floors.  Keep items that you use a lot in easy-to-reach places.  If you need to reach something above you, use a strong step stool that has a grab bar.  Keep electrical cords out of the way.  Do not use floor polish or wax that makes floors slippery. If you must use wax, use non-skid  floor wax.  Do not have throw rugs and other things on the floor that can make you trip. What can I do with my stairs?  Do not leave any items on the stairs.  Make sure that there are handrails on both sides of the stairs and use them. Fix handrails that are broken or loose. Make sure that handrails are as long as the stairways.  Check any carpeting to make sure that it is firmly attached to the stairs. Fix any carpet that is loose or worn.  Avoid having throw rugs at the top or bottom of the stairs. If you do have throw rugs, attach them to the floor with carpet tape.  Make sure that you have a light switch at the top of the stairs and the bottom of the stairs. If you do not have them, ask someone to add them for you. What else can I do to help prevent falls?  Wear shoes that:  Do not have high heels.  Have rubber bottoms.  Are comfortable and fit you well.  Are closed at the toe. Do not wear sandals.  If you  use a stepladder:  Make sure that it is fully opened. Do not climb a closed stepladder.  Make sure that both sides of the stepladder are locked into place.  Ask someone to hold it for you, if possible.  Clearly mark and make sure that you can see:  Any grab bars or handrails.  First and last steps.  Where the edge of each step is.  Use tools that help you move around (mobility aids) if they are needed. These include:  Canes.  Walkers.  Scooters.  Crutches.  Turn on the lights when you go into a dark area. Replace any light bulbs as soon as they burn out.  Set up your furniture so you have a clear path. Avoid moving your furniture around.  If any of your floors are uneven, fix them.  If there are any pets around you, be aware of where they are.  Review your medicines with your doctor. Some medicines can make you feel dizzy. This can increase your chance of falling. Ask your doctor what other things that you can do to help prevent falls. This information is not intended to replace advice given to you by your health care provider. Make sure you discuss any questions you have with your health care provider. Document Released: 11/21/2008 Document Revised: 07/03/2015 Document Reviewed: 03/01/2014 Elsevier Interactive Patient Education  2017 [employer].

## 2020-03-27 NOTE — Progress Notes (Signed)
Subjective:    Patient ID: Charles Houston, male    DOB: 02/21/1950, 70 y.o.   MRN: [id number]  Chief Complaint  Patient presents with  . Follow-up    HPI Patient was seen today for follow-up.  Seen earlier today for Medicare wellness with wellness nurse.  Patient states he is doing well overall.  He plans to retire in May 2022 and moved to [place], [place] which is [employer] [place].  Patient currently commuting for work during the week.  Patient plans to visit his granddaughter and go to [place] after he retires.  Patient inquires about finding a urologist in [place] given his history of prostate cancer.  Last PSA, 8.3 in November 2021.  Patient denies current symptoms. Notes drinking a bottle or 2 or wine most nights.  Pt denies issues with drinking or withdrawal symptoms.  Has stopped for [place]. Inquires about immunizations.      Past Medical History:  Diagnosis Date  . Basal cell carcinoma 06/14/2019   mid forehead([facility])  . Prostate cancer ([place])     No Known Allergies  ROS General: Denies fever, chills, night sweats, changes in weight, changes in appetite HEENT: Denies headaches, ear pain, changes in vision, rhinorrhea, sore throat CV: Denies CP, palpitations, SOB, orthopnea Pulm: Denies SOB, cough, wheezing GI: Denies abdominal pain, nausea, vomiting, diarrhea, constipation GU: Denies dysuria, hematuria, frequency Msk: Denies muscle cramps, joint pains Neuro: Denies weakness, numbness, tingling Skin: Denies rashes, bruising Psych: Denies depression, anxiety, hallucinations     Objective:    Blood pressure 140/80, pulse 85, temperature 98 F (36.7 C), temperature source Oral, height 5\' 7"  (1.702 m), weight 182 lb 14.4 oz (83 kg), SpO2 97 %.  Gen. Pleasant, well-nourished, in no distress, normal affect    HEENT: /AT, face symmetric, conjunctiva clear, no scleral icterus, PERRLA, EOMI, nares patent without drainage, pharynx without erythema or exudate. Neck: No  JVD, no thyromegaly, no carotid bruits Lungs: no accessory muscle use, CTAB, no wheezes or rales Cardiovascular: RRR, no m/r/g, no peripheral edema Abdomen: BS present, soft, NT/ND, no hepatosplenomegaly. Musculoskeletal: No deformities, no cyanosis or clubbing, normal tone Neuro:  A&Ox3, CN II-XII intact, normal gait Skin:  Warm, no lesions/ rash   Wt Readings from Last 3 Encounters:  03/27/20 182 lb 14.4 oz (83 kg)  03/27/20 182 lb 9 oz (82.8 kg)  01/11/18 173 lb (78.5 kg)    Lab Results  Component Value Date   WBC 5.8 07/25/2017   HGB 14.4 07/25/2017   HCT 41.5 07/25/2017   PLT 285.0 07/25/2017   GLUCOSE 96 07/25/2017   CHOL 165 12/29/2017   TRIG 69.0 12/29/2017   HDL 67.20 12/29/2017   LDLCALC 84 12/29/2017   NA 141 07/25/2017   K 4.3 07/25/2017   CL 103 07/25/2017   CREATININE 0.84 07/25/2017   BUN 18 07/25/2017   CO2 28 07/25/2017   HGBA1C 5.6 07/25/2017    Assessment/Plan:  Prostate cancer ([place]) -last PSA 8.3 on 12/27/19 -continue f/u with Urology -discussed finding a new Urologist in [place]: CBC with Differential/Platelet, PSA  Mixed hyperlipidemia  -continue lifestyle modifications - Plan: Lipid panel  Alcohol abuse counseling and surveillance  -discussed cutting down on EtOH intake - Plan: Comprehensive metabolic panel, CBC with Differential/Platelet  Need for hepatitis C screening test  - Plan: Hep C Antibody  F/u prn  Grier Mitts, MD

## 2020-03-28 LAB — HEPATITIS C ANTIBODY
Hepatitis C Ab: NONREACTIVE
SIGNAL TO CUT-OFF: 0.01 (ref <1.00)

## 2020-04-12 ENCOUNTER — Encounter: Payer: Self-pay | Admitting: Family Medicine

## 2020-05-12 ENCOUNTER — Other Ambulatory Visit: Payer: Self-pay

## 2020-05-13 ENCOUNTER — Encounter: Payer: Self-pay | Admitting: Family Medicine

## 2020-05-13 ENCOUNTER — Ambulatory Visit (INDEPENDENT_AMBULATORY_CARE_PROVIDER_SITE_OTHER): Payer: BLUE CROSS/BLUE SHIELD | Admitting: Family Medicine

## 2020-05-13 VITALS — BP 138/80 | HR 78 | Temp 98.4°F | Wt 184.0 lb

## 2020-05-13 DIAGNOSIS — S31139A Puncture wound of abdominal wall without foreign body, unspecified quadrant without penetration into peritoneal cavity, initial encounter: Secondary | ICD-10-CM | POA: Diagnosis not present

## 2020-05-13 DIAGNOSIS — Z23 Encounter for immunization: Secondary | ICD-10-CM

## 2020-05-13 LAB — PSA: PSA: 8.08

## 2020-05-13 MED ORDER — CEPHALEXIN 500 MG PO CAPS: 500.0000 mg | ORAL_CAPSULE | Freq: Three times a day (TID) | ORAL | 0 refills | Status: AC

## 2020-05-13 NOTE — Addendum Note (Signed)
Addended by: Wyvonne Lenz on: 05/13/2020 11:40 AM   Modules accepted: Orders

## 2020-05-13 NOTE — Progress Notes (Signed)
   Subjective:    Patient ID: Charles Houston, male    DOB: 1950/04/22, 70 y.o.   MRN: [id number]  HPI Here for advice about a puncture wound that he got about 2 weeks ago. 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 He cleaned the wound and applied neosporin. Since then the are has remained red and is slightly tender. No drainage. No fever.    Review of Systems  Constitutional: Negative.   Respiratory: Negative.   Cardiovascular: Negative.   Gastrointestinal: Negative.   Skin: Positive for wound.       Objective:   Physical Exam Constitutional:      Appearance: Normal appearance. He is not ill-appearing.  Cardiovascular:     Rate and Rhythm: Normal rate and regular rhythm.     Pulses: Normal pulses.     Heart sounds: Normal heart sounds.  Pulmonary:     Effort: Pulmonary effort is normal.     Breath sounds: Normal breath sounds.  Abdominal:     General: Abdomen is flat. Bowel sounds are normal. There is no distension.     Palpations: Abdomen is soft. There is no mass.     Tenderness: There is no guarding or rebound.     Hernia: No hernia is present.     Comments: There is an area in the RLQ which has macular erythema and scaling. This is mildly tender. No foreign bodies are felt under the surface.   Neurological:     Mental Status: He is alert.           Assessment & Plan:  Puncture wound with borderline cellulitis. Treat with 10 days of Keflex. Given a TDaP. Recheck as needed.  Alysia Penna, MD

## 2020-05-22 ENCOUNTER — Encounter: Payer: Self-pay | Admitting: Family Medicine

## 2020-05-26 ENCOUNTER — Other Ambulatory Visit: Payer: Self-pay

## 2020-05-26 ENCOUNTER — Ambulatory Visit (INDEPENDENT_AMBULATORY_CARE_PROVIDER_SITE_OTHER): Payer: BLUE CROSS/BLUE SHIELD

## 2020-05-26 DIAGNOSIS — Z23 Encounter for immunization: Secondary | ICD-10-CM

## 2021-03-24 ENCOUNTER — Telehealth: Payer: Self-pay | Admitting: Family Medicine

## 2021-03-24 NOTE — Telephone Encounter (Signed)
Please remove pcp  Spoke with patient to schedule Medicare Annual Wellness Visit (AWV) either virtually or in office.   Patient stated he moved back to [place]  03/27/20 please schedule at anytime with [facility] Nurse Health Advisor 1 or 2   This should be a 45 minute visit.
